# Patient Record
Sex: Female | Born: 1955
Health system: Southern US, Community
[De-identification: ages and names within clinical notes are randomized; demographics above are authoritative.]

## PROBLEM LIST (undated history)

## (undated) DIAGNOSIS — M1990 Unspecified osteoarthritis, unspecified site: Secondary | ICD-10-CM

## (undated) DIAGNOSIS — G4730 Sleep apnea, unspecified: Secondary | ICD-10-CM

## (undated) DIAGNOSIS — I10 Essential (primary) hypertension: Secondary | ICD-10-CM

## (undated) DIAGNOSIS — F32A Depression, unspecified: Secondary | ICD-10-CM

## (undated) DIAGNOSIS — N189 Chronic kidney disease, unspecified: Secondary | ICD-10-CM

## (undated) DIAGNOSIS — F329 Major depressive disorder, single episode, unspecified: Secondary | ICD-10-CM

## (undated) HISTORY — PX: LIPOMA EXCISION: SHX5283

## (undated) HISTORY — PX: DG THUMB LEFT HAND: HXRAD1658

---
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
## 1992-10-07 HISTORY — PX: KNEE ARTHROSCOPY: SUR90

## 1997-07-10 ENCOUNTER — Other Ambulatory Visit: Admission: RE | Admit: 1997-07-10 | Discharge: 1997-07-10 | Payer: Self-pay | Admitting: Obstetrics & Gynecology

## 1998-05-05 ENCOUNTER — Other Ambulatory Visit: Admission: RE | Admit: 1998-05-05 | Discharge: 1998-05-05 | Payer: Self-pay | Admitting: Obstetrics & Gynecology

## 1998-05-22 ENCOUNTER — Inpatient Hospital Stay (HOSPITAL_COMMUNITY): Admission: RE | Admit: 1998-05-22 | Discharge: 1998-05-26 | Payer: Self-pay | Admitting: Orthopedic Surgery

## 1998-05-24 HISTORY — PX: JOINT REPLACEMENT: SHX530

## 1999-05-21 ENCOUNTER — Other Ambulatory Visit: Admission: RE | Admit: 1999-05-21 | Discharge: 1999-05-21 | Payer: Self-pay | Admitting: Obstetrics & Gynecology

## 1999-09-08 ENCOUNTER — Encounter (INDEPENDENT_AMBULATORY_CARE_PROVIDER_SITE_OTHER): Payer: Self-pay | Admitting: *Deleted

## 1999-09-08 ENCOUNTER — Ambulatory Visit (HOSPITAL_BASED_OUTPATIENT_CLINIC_OR_DEPARTMENT_OTHER): Admission: RE | Admit: 1999-09-08 | Discharge: 1999-09-08 | Payer: Self-pay | Admitting: Surgery

## 2000-06-21 ENCOUNTER — Other Ambulatory Visit: Admission: RE | Admit: 2000-06-21 | Discharge: 2000-06-21 | Payer: Self-pay | Admitting: Obstetrics & Gynecology

## 2001-08-22 ENCOUNTER — Other Ambulatory Visit: Admission: RE | Admit: 2001-08-22 | Discharge: 2001-08-22 | Payer: Self-pay | Admitting: Obstetrics & Gynecology

## 2002-05-15 ENCOUNTER — Observation Stay (HOSPITAL_COMMUNITY): Admission: RE | Admit: 2002-05-15 | Discharge: 2002-05-16 | Payer: Self-pay | Admitting: Obstetrics & Gynecology

## 2002-05-15 ENCOUNTER — Encounter (INDEPENDENT_AMBULATORY_CARE_PROVIDER_SITE_OTHER): Payer: Self-pay | Admitting: Specialist

## 2002-06-25 HISTORY — PX: MOUTH SURGERY: SHX715

## 2002-08-28 ENCOUNTER — Other Ambulatory Visit: Admission: RE | Admit: 2002-08-28 | Discharge: 2002-08-28 | Payer: Self-pay | Admitting: Obstetrics & Gynecology

## 2002-12-18 HISTORY — PX: KNEE ARTHROSCOPY: SUR90

## 2003-09-24 ENCOUNTER — Other Ambulatory Visit: Admission: RE | Admit: 2003-09-24 | Discharge: 2003-09-24 | Payer: Self-pay | Admitting: Obstetrics & Gynecology

## 2004-10-26 ENCOUNTER — Other Ambulatory Visit: Admission: RE | Admit: 2004-10-26 | Discharge: 2004-10-26 | Payer: Self-pay | Admitting: Obstetrics & Gynecology

## 2004-12-21 ENCOUNTER — Encounter: Admission: RE | Admit: 2004-12-21 | Discharge: 2004-12-21 | Payer: Self-pay | Admitting: Obstetrics & Gynecology

## 2005-02-23 ENCOUNTER — Inpatient Hospital Stay (HOSPITAL_COMMUNITY): Admission: RE | Admit: 2005-02-23 | Discharge: 2005-02-26 | Payer: Self-pay | Admitting: Orthopedic Surgery

## 2005-02-23 HISTORY — PX: JOINT REPLACEMENT: SHX530

## 2005-09-22 ENCOUNTER — Ambulatory Visit (HOSPITAL_BASED_OUTPATIENT_CLINIC_OR_DEPARTMENT_OTHER): Admission: RE | Admit: 2005-09-22 | Discharge: 2005-09-22 | Payer: Self-pay | Admitting: Orthopedic Surgery

## 2005-09-22 HISTORY — PX: OTHER SURGICAL HISTORY: SHX169

## 2006-01-04 ENCOUNTER — Ambulatory Visit (HOSPITAL_BASED_OUTPATIENT_CLINIC_OR_DEPARTMENT_OTHER): Admission: RE | Admit: 2006-01-04 | Discharge: 2006-01-04 | Payer: Self-pay | Admitting: Orthopedic Surgery

## 2006-01-04 HISTORY — PX: TOTAL SHOULDER ARTHROPLASTY: SHX126

## 2007-10-12 ENCOUNTER — Encounter: Admission: RE | Admit: 2007-10-12 | Discharge: 2007-10-12 | Payer: Self-pay | Admitting: Orthopedic Surgery

## 2007-11-23 ENCOUNTER — Ambulatory Visit (HOSPITAL_BASED_OUTPATIENT_CLINIC_OR_DEPARTMENT_OTHER): Admission: RE | Admit: 2007-11-23 | Discharge: 2007-11-23 | Payer: Self-pay | Admitting: Orthopedic Surgery

## 2010-06-30 NOTE — Op Note (Signed)
NAME:  Jacqueline Riggs, Jacqueline Riggs                  ACCOUNT NO.:  [id number]   MEDICAL RECORD NO.:  [id number]          PATIENT TYPE:  AMB   LOCATION:  NESC                         FACILITY:  [facility]   PHYSICIAN:  Deidre Ala, M.D.    DATE OF BIRTH:  Oct 26, 1955   DATE OF PROCEDURE:  11/23/2007  DATE OF DISCHARGE:                               OPERATIVE REPORT   PREOPERATIVE DIAGNOSES:  1. Impingement tendinitis, supraspinatus left shoulder with type III      acromion.  2. AC (acromioclavicular) joint arthritis.  3. Partial thickness rotator cuff tear.   POSTOPERATIVE DIAGNOSES:  1. Left shoulder impingement with supraspinatus tendinosis and type      III acromion.  2. Partial SLAP (superior labrum anterior posterior) tear, stable.  3. Very arthritic distal clavicle.  4. Subdeltoid bursitis.   SURGEON:  1. Charlesetta Shanks, M.D.   PROCEDURES:  1. Left shoulder operative arthroscopy with subacromial arch      decompression acromioplasty.  2. Arthroscopic distal clavicle resection.  3. Debridement of subdeltoid bursectomy and debride SLAP lesion.   ASSISTANT:  Phineas Semen, P.A.   ANESTHESIA:  General with endotracheal and scalene block.   CULTURES:  None.   DRAINS:  None.   ESTIMATED BLOOD LOSS:  Minimal.   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  She has had right shoulder impingement, AC joint arthritis  and underwent successful SAD/DCR in the past.  The left shoulder has  been bothersome.  It has been unresponsive to conservative measures and  cortisone injections.  MRI was suggestive and showed moderate  supraspinatus tendinopathy and AC joint arthritis.  Due to persistence  of her discomfort it was elected to proceed with surgical intervention.  At surgery she had a type I SLAP fraying.  The biceps tendon was intact  and did not look pathologic.  She did not have detachment.  It was not  unstable but it was frayed.  This was debrided and  smoothed with the  ablator.  Undersurface of the rotator cuff looked good.  Glenohumeral  surface otherwise looked good.  There was some anterior glenoid fraying.  She had a sharp prominent anterior acromion with a prominent CA  ligament.  She was resected to [facility] margins including the CA  ligament.  She had an obviously arthritic distal clavicle.  She had an  intense subdeltoid bursitis that was debrided.  The rotator cuff was  intact to the tuberosity on all views.  She did have some partial  thickness fraying of the rotator cuff especially superiorly in the  critical zone.   PROCEDURE IN DETAIL:  With adequate anesthesia obtained using general  endotracheal and scalene block technique the patient was placed in the  supine beach chair position.  The left shoulder was prepped and draped  in the standard fashion.  After standard prepping and draping, skin  markings were made for anatomic positioning.  I then entered the  shoulder through a posterior portal.  Anterior portal was established  just lateral to the coracoid.  I then probed the superior labrum, shaved  it and smoothed it with the ablator on 1 and checked the biceps tendon.  Portals were reversed and similar shavings carried out.  Some light  shaving carried out on the anterior and posterior labrum.  I then  entered the subacromial space of the posterior portal.  Anterolateral  portal was established.  I then shaved the soft tissue from the anterior  undersurface of the acromion and ablated to cauterize.  The CA ligament  was released with a hook ArthroCare.  I then completed acromioplasty to  the roof of the subacromial space in the manner of Caspari.  The scope  was then turned medially sideways and through the anterior portal I  debrided the AC meniscus, brought in the shaver, further debrided and  cauterized.  I then completed distal clavicle resection two shaver  breadths in.  I then entered the shoulder from the  lateral portal and  with neutral internal and external rotation I debrided the subdeltoid  bursa and used the ablator to smooth.  I then lightly debrided/ablated  central zone/critical zone fraying of the rotator cuff.  The shoulder  was then irrigated through the scope.  Marcaine was not used since we  had a block.  The portals were closed with 4-0 nylon.  A bulky sterile  compressive dressing was applied with sling and the patient having  tolerated the procedure well was awakened and taken to the recovery room  in satisfactory condition to be discharged per outpatient routine and  given Percocet for pain and told to call the office for recheck  tomorrow.      Deidre Ala, M.D.  Electronically Signed     VEP/MEDQ  D:  11/23/2007  T:  11/23/2007  Job:  [id number]

## 2010-07-03 NOTE — Discharge Summary (Signed)
NAME:  Jacqueline Riggs, Jacqueline Riggs                  ACCOUNT NO.:  [id number]   MEDICAL RECORD NO.:  [id number]          PATIENT TYPE:  INP   LOCATION:  1516                         [facility]ACILITY:  [facility]   PHYSICIAN:  Deidre Ala, M.D.    DATE OF BIRTH:  01-13-1956   DATE OF ADMISSION:  02/23/2005  DATE OF DISCHARGE:  02/26/2005                                 DISCHARGE SUMMARY   ADMISSION DIAGNOSES:  1.  End-stage osteoarthritis of the right knee.  2.  Hypertension.  3.  Hormone replacement therapy.  4.  Gastroesophageal reflux disease.  5.  Allergies.  6.  Depression.   DISCHARGE DIAGNOSES:  1.  End-stage osteoarthritis of the right knee.  2.  Status post right toe and knee arthroplasty.  3.  Hypertension.  4.  Hormone replacement therapy.  5.  Gastroesophageal reflux disease.  6.  Allergies.  7.  Depression.  8.  Postoperative hemorrhagic anemia, stable at the time of discharge.   PROCEDURES:  The patient was taken to the operating room on February 23, 2005  and underwent a right total knee arthroplasty.  The surgeon was Dr. Deidre Ala.  Assistant is Clarene Reamer, P.A.-C.  The surgery was done under  spinal anesthesia.  Hemovac drain x1 was placed at the time of surgery.   CONSULTATIONS:  1.  Physical therapy.  2.  Occupational therapy.  3.  Social work.  4.  Case management.   BRIEF HISTORY:  The patient is a 55 year old female with a history of right  knee pain.  She has previously undergone a left total knee arthroplasty.  She has also previously undergone a right knee arthroscopy followed by a  series of __________ arch injections which have failed her to this point.  Upon failure of conservative treatment, Dr. Renae Fickle felt that it was best to  proceed with the right total knee arthroplasty.  The patient agreed.  The  risks and benefits of the surgery were discussed with the patient and the  patient wished to proceed.   LABORATORY DATA:  CBC, on admission, showed a hemoglobin of  13.4, hematocrit  40.0, white blood cell count 5.1, red blood cell count 4.41.  Serial H&H  were followed throughout the hospital stay.  Hemoglobin and hematocrit did  decline to 10.5 and 31.0, respectively but was stable by the time of  discharge.  Differential on admission was all within normal limits.  Coagulation studies on admission were all within normal limits.  PT and INR  at the time of discharge were 28.4 and 2.7, respectively on Coumadin  therapy.  Routine chemistry on admission was all within normal limits.  Serum chemistries were also taken throughout hospital stay.  Glucose ranged  from low of 95 on day of discharge to a high 127 on February 24, 2005.  BUN  did fall to 3 on February 25, 2005 but was back in the normal range the  following day.  Urinalysis showed color amber, appearance hazy, bilirubin  small amount, and ketones trace amount.  The patient's blood type is O-  positive with antibody screen negative.  Preop chest x-ray showed no active  cardiopulmonary disease.  Preop EKG showed marked sinus tachycardia with  nonspecific ST abnormality.   HOSPITAL COURSE:  The patient was admitted to [facility] and taken  to the operating room.  She underwent the above stated procedure without  complications.  The patient tolerated the procedure well, allowed to return  to recovery room on orthopedic floor and obtained postoperative care.  Postoperative day #1, the patient was resting comfortably.  Hemoglobin and  hematocrit 11.2 and 33.3, T-max was 99.4.  She was neurovascular intact to  the right lower extremity.  Dressing was clean, dry and intact.  The patient  was to work with physical therapy and occupational therapy.  On  postoperative day #2, the patient was resting comfortably.  T-max was 100.9.  Hemoglobin and hematocrit 11.1 and 32.7.  Incision was clean, dry and  intact.  She remained neurovascular intact to the right lower extremity.  The patient is to work  with physical therapy and occupational therapy.  PCA  was discontinued on this day.  IV was heplocked, dressing was changed with  plan of being discharged home the following day.  On February 26, 2005,  postoperative day #3, the patient was doing well.  T-max 99.  She was ready  for discharge.  Hemoglobin and hematocrit 10.5 and 31.0.  Incision was  clean, dry and intact and patient was to be discharged home on this date.   DISPOSITION:  The patient was discharged home on February 26, 2005.   DISCHARGE MEDICATIONS:  1.  Duragesic patch 25 mcg, 1 patch q.72h.  2.  Percocet 5/325 mg, 1-2 p.o. q.4-6h. p.r.n. pain.  3.  Robaxin 500 mg, 1 p.o. q.6-8h. p.r.n. spasm.  4.  Coumadin per pharmacy protocol.  5.  Trinsicon caplets #90, 1 p.o. t.i.d.   DIET:  As tolerated.   ACTIVITY:  The patient is weight-bearing as tolerated to the right lower  extremity.   WOUND CARE:  The patient is to have daily dressing changes performed until  no drainage.  She may shower when no drainage.   FOLLOW UP:  The patient will follow up with Dr. Renae Fickle in two weeks from the  date of surgery.  She is to call the office for an appointment at [phone].   CONDITION ON DISCHARGE:  Stable and improved.     ______________________________  Clarene Reamer, P.A.-C.    ______________________________  V. Charlesetta Shanks, M.D.   SW/MEDQ  D:  02/26/2005  T:  02/26/2005  Job:  [id number]

## 2010-07-03 NOTE — Op Note (Signed)
NAME:  Jacqueline Riggs, Jacqueline Riggs                            ACCOUNT NO.:  [id number]   MEDICAL RECORD NO.:  [id number]                   PATIENT TYPE:  OBV   LOCATION:  9399                                 FACILITY:  [facility]   PHYSICIAN:  Freddy Finner, M.D.                DATE OF BIRTH:  [birth year]   DATE OF PROCEDURE:  05/15/2002  DATE OF DISCHARGE:                                 OPERATIVE REPORT   PREOPERATIVE DIAGNOSIS:  Uterine fibroids, persistent cystic left ovarian  mass.   POSTOPERATIVE DIAGNOSIS:  Uterine fibroids, persistent cystic left ovarian  mass.   PROCEDURE:  Laparoscopically-assisted vaginal hysterectomy, bilateral  salpingo-oophorectomy.   SURGEON:  Freddy Finner, M.D.   ASSISTANT:  Tracie Harrier, M.D.   ESTIMATED BLOOD LOSS:  100 mL.   COMPLICATIONS:  None.   ANESTHESIA:  General endotracheal.   INDICATIONS FOR PROCEDURE:  Details of the present illness are recorded in  the admission note. The patient was admitted on the morning of surgery. She  has numerous allergies to medications. She was given Clindamycin 600 mg IV  preoperatively for prophylaxis. She was placed in PAS hose.   DESCRIPTION OF PROCEDURE:  She was brought to the operating room and placed  under adequate general endotracheal anesthesia and placed in the dorsal  lithotomy using the [facility] stirrup system. Betadine prep with scrub followed  by solution was carried out of the abdomen, perineum, and vagina. The  bladder was evacuated with non-latex sterile catheter. Hulka tenaculum was  attached to the cervix without difficulty under direct visualization.  Sterile drapes were applied.  Two small incisions were made, one at the  umbilicus and one just above the symphysis.  An 11 mm non-bladed disposable  trocar was placed at the umbilicus. Inspection eventually confirmed entry  into the peritoneum with no evidence of injury.  The pneumoperitoneum was  allowed to accumulate.  5 mm trocar was placed  through the lower incision  under direct visualization. Scanning inspection of the abdomen and pelvic  structures was carried out. Photographs were made and retained in the office  record. The appendix was visualized and it was normal. The right tube and  ovary were normal. The left ovary was cystically enlarged, but no external  excrescences and no evidence of any intraperitoneal disease. The uterus was  irregularly nodular and approximately 9 weeks size.  Using the spring-loaded  grasping forceps through the lower trocar, the adnexa were elevated and then  with progressive pedicles, the infundibulopelvic and upper broad ligaments  were sealed and divided using the Gyrus bipolar device. This was carried  down to a level just above the uterine arteries. Hemostasis at this point  was adequate. Attention was turned vaginally.  Posterior weighted retractor  was placed. The cervix was grasped with a Jacobs tenaculum and the Hulka  tenaculum removed. Deavers were used to retract the lateral  and anterior  vaginal walls.  The mucosa posterior to the cervix was grasped with an Allis  and entered sharply with Mayo scissors. The cervix was circumscribed with  the scalpel to release the mucosa. Using the LigaSure system, the  uterosacral pedicles were sealed and divided.  Bladder pillars were taken  separately, sealed, and divided.  The bladder was carefully advanced off the  cervix. Cardinal ligament pedicles were sealed and divided using LigaSure.  Anterior peritoneum was entered.  Vessel pedicles were taken on each side,  sealed, and divided.  An additional pedicle was taken on either side above  the vessels, sealed, and divided. The uterus was then delivered through the  vaginal introitus. One remaining small pedicle on the right was sealed with  LigaSure. Angles of the vagina were then anchored to the uterosacrals with  mattress suture of 0 Monocryl. The uterosacrals were plicated. The posterior   peritoneum was closed with a 0 Monocryl single suture. Cuff was closed  vertically with figure-of-eights of 0 Monocryl. Attention was redirected  abdominally after placement of the Silastic Foley. The Nezhat irrigating  system was used. Careful examination of all pedicles was carried out. Scant  amount of oozing was noted near the uterosacrals off the vaginal apex and  this was controlled with the Gyrus system. With complete hemostasis, all of  the irrigating solution was aspirated from the abdomen.  Gas was allowed to  escape from the abdomen. Skin incisions were closed with interrupted  subcuticular sutures of 3-0 Dexon.  0.5% plain Marcaine was injected through  the incision sites for postoperative analgesia. Steri-Strips were applied to  the lower incisions. The patient tolerated the procedure well. She was  awakened and taken to the recovery room in good condition.                                               Freddy Finner, M.D.    WRN/MEDQ  D:  05/15/2002  T:  05/15/2002  Job:  [id number]

## 2010-07-03 NOTE — H&P (Signed)
NAME:  Jacqueline Riggs, Jacqueline Riggs                            ACCOUNT NO.:  [id number]   MEDICAL RECORD NO.:  [id number]                   PATIENT TYPE:  AMB   LOCATION:  SDC                                  FACILITY:  [facility]   PHYSICIAN:  Freddy Finner, M.D.                DATE OF BIRTH:  1955/12/30   DATE OF ADMISSION:  05/15/2002  DATE OF DISCHARGE:                                HISTORY & PHYSICAL   ADMISSION DIAGNOSES:  1. Persistent left adnexal cystic mass.  2. Uterine leiomyomata.  3. Chronic pelvic pain.   HISTORY OF PRESENT ILLNESS:  The patient is a 55 year old white married  female, nulligravida, who has a long history of pelvic pain.  She has  uterine leiomyomata which had progressively increased in size.  She has a  simple cystic left adnexal mass which has persisted now for three months.  Due to the constellation of findings, the patient has requested definitive  surgery.  She is now admitted for a laparoscopically-assisted vaginal  hysterectomy and bilateral salpingo-oophorectomy.  She has reviewed a video  in the office describing the operative procedure, including the potential  risks of the procedure.   REVIEW OF SYSTEMS:  Her current review of systems is otherwise negative  including cardiac, pulmonary, GI, or GENITOURINARY complaints.   PAST MEDICAL HISTORY:  The patient is known to have hypertension.  She has  no other known significant medical problems.   MEDICATIONS:  1. For hypertension Lotrel 5/20, one daily.  2. Labetalol 200 mg, two daily.  3. Other medications on a chronic basis include Estratest 1.25 mg daily and     2.5 mg daily, a total of 3.75 mg daily.  4. Prometrium 100 mg daily.  5. Serzone 200 mg, three daily.  6. Vioxx 25 mg, one daily.  7. Lipitor 5 mg, one daily.   ALLERGIES:  Numerous, including PENICILLIN, SULFA, CECLOR, RELAFEN AND  HYDROCHLOROTHIAZIDE.   PAST SURGICAL HISTORY:  Knee surgeries on three different occasions in 1994,  1998, and  1999.  She has never had a blood transfusion.   SOCIAL HISTORY:  She does not use cigarettes.  She uses alcohol  occasionally.   FAMILY HISTORY:  Noncontributory.   PHYSICAL EXAMINATION:  HEENT:  Grossly within normal limits.  VITAL SIGNS:  Blood pressure in the office 136/78.  NECK:  The thyroid gland is not palpably enlarged.  CHEST:  Clear to auscultation throughout.  HEART:  Normal sinus rhythm without murmurs, rubs, or gallops.  BREASTS:  Examination is normal.  No palpable masses or skin changes, no  nipple discharge.  The most recent mammogram in July 2003, was normal.  ABDOMEN:  Soft, nontender, without appreciable organomegaly or palpable  masses.  PELVIC:  External genitalia, vagina and cervix were normal.  Uterus is  palpably enlarged on bimanual examination to about 8-10 weeks size,  compromised somewhat by body  habitus.  There is some tenderness to deep  palpation in the right adnexa, but no palpable mass in this location.  The  left adnexa is not palpably enlarged.  RECTAL:  The rectum is normal.  RECTOVAGINAL:  The examination confirms the above findings.  EXTREMITIES:  Without clubbing, cyanosis, or edema.  Ultrasounds in the office serially have shown persistence of a simple left  ovarian mass measuring 3.1 cm x 3.3 cm x 2.7 cm.  At least five leiomyomata  are also identified, the largest being approximately 3.5 cm.   ASSESSMENT:  1. Uterine leiomyomata.  2. Persistent cystic left adnexal mass.  3. Chronic pelvic pain.   PLAN:  Laparoscopically-assisted vaginal hysterectomy with bilateral  salpingo-oophorectomy, antibiotic prophylaxis along with compression hose  for deep vein thrombosis prophylaxis.                                                 Freddy Finner, M.D.    WRN/MEDQ  D:  05/14/2002  T:  05/14/2002  Job:  [id number]

## 2010-07-03 NOTE — Op Note (Signed)
NAME:  BRUNETTA, NEWINGHAM                  ACCOUNT NO.:  [id number]   MEDICAL RECORD NO.:  [id number]          PATIENT TYPE:  AMB   LOCATION:  DSC                          FACILITY:  [facility]   PHYSICIAN:  Deidre Ala, M.D.    DATE OF BIRTH:  11/11/1955   DATE OF PROCEDURE:  09/22/2005  DATE OF DISCHARGE:                                 OPERATIVE REPORT   SURGEON:  1.  Charlesetta Shanks, M.D.   ASSISTANT:  Thereasa Distance, P.A.-C.   PREOPERATIVE DIAGNOSIS:  Left third finger trigger finger, stenosing  tenosynovitis, A1 pulley.   POSTOPERATIVE DIAGNOSIS:  Left third finger trigger finger, stenosing  tenosynovitis, A1 pulley.   PROCEDURE:  Left third finger, middle finger release of A1 pulley, trigger  finger stenosing tenosynovitis.   ANESTHESIA:  IV regional.   CULTURES:  None.   DRAINS:  None.   ESTIMATED BLOOD LOSS:  Minimal.   TOURNIQUET TIME:  30 minutes.   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  She has had difficulties with triggering of the left third finger.  She came on 08/09/05.  Two cortisone shots have ensued, but the finger is  still catching.  She desired release.  At surgery, no untoward features were  noted.  The A1 pulley was released well with good gliding of the tendon.   DESCRIPTION OF PROCEDURE:  With adequate anesthesia obtained using IV  regional technique, 300 mg Cleocin given IV prophylaxis, the patient was  placed in the supine position and the left hand was prepped from the  fingertips to the upper forearm in the standard fashion.  After standard  prepping and draping, an incision was made obliquely in Langer skin lines at  the distal palmar flexion crease transversely and obliquely to the A1  pulley.  Incision was deepened sharply to allow for hemostasis to be  obtained using the Bovie electrocoagulator.  Under loupe magnification,  careful dissection was carried down to the A1 pulley and retractors were  placed.  I  then released it with scissors and removed some of the A1 pulley  on either side.  The tendon was then seen to glide freely.  Irrigation was  carried out and the wound was closed with a running 4-0 nylon.  A bulky  sterile compressive  dressing was applied to allow finger motion. The patient, having tolerated  the procedure well, was awakened and taken to the recovery room in  satisfactory condition, to be discharged per outpatient routine, given  Vicodin for pain and told to call the office for an appointment for recheck  on Friday.           ______________________________  V. Charlesetta Shanks, M.D.     VEP/MEDQ  D:  09/22/2005  T:  09/22/2005  Job:  [id number]

## 2010-07-03 NOTE — Op Note (Signed)
NAME:  Jacqueline Riggs, Jacqueline Riggs                  ACCOUNT NO.:  [id number]   MEDICAL RECORD NO.:  [id number]          PATIENT TYPE:  AMB   LOCATION:  [facility]                         FACILITY:  [facility]   PHYSICIAN:  Deidre Ala, M.D.    DATE OF BIRTH:  [birth year]   DATE OF PROCEDURE:  01/04/2006  DATE OF DISCHARGE:                                 OPERATIVE REPORT   PREOPERATIVE DIAGNOSES:  1. Right shoulder impingement syndrome with type 2 to 3 acromion.  2. Rule out right shoulder rotator cuff tear.  3. Acromioclavicular joint arthritis. right shoulder.  4. Right shoulder biceps long head tendinitis with high-grade tear.   POSTOPERATIVE DIAGNOSES:  1. Right shoulder nonrepairable rotator cuff tear, extensive.  2. Right shoulder impingement with type 3 acromion.  3. High-grade tear, biceps tendon, intracapsular.  4. Labral tearing.   OPERATION:  1. Right shoulder operative arthroscopy with subacromial arch      decompression acromioplasty.  2. Extensive debridement of right shoulder nonrepairable rotator cuff      tear.  3. Debridement of glenoid labrum and also biceps tenotomy - intracapsular.  4. Arthroscopic distal clavicle resection.  5. Tuberosity plasty, right shoulder.   SURGEON:  1. Charlesetta Shanks, M.D.   ASSISTANT:  Clarene Reamer, P.A.-C.   ANESTHESIA:  General endotracheal.   CULTURES:  None.   DRAINS:  None.   BLOOD LOSS:  Minimal.   PATHOLOGIC FINDINGS AND HISTORY:  Avis is a 55 year old female with high  body mass index with multiple histories of orthopedics problems in the past  including trigger fingers, painful thumb, bilateral total knees.  She is a  career Engineer, manufacturing and has had greater than six-month history of  right shoulder pain without known injury.  Initial x-rays revealed a type 2  to 3 acromion.  She had evidence of a biceps tendinitis and received several  cortisone injections.  Ultimately, an MRI scan showed poor delineation of  the biceps tendon in  the region of the bicipital groove suggestive of a high-  grade tear, partial versus complete.  There was tendinosis of the rotator  cuff suggestive of a partial-thickness tear.  There was AC joint  degenerative change with slight irregularity of the labrum.  After failure  of conservative management, she elected to proceed with arthroscopic  intervention.  At surgery, she had no slap detachment, fraying of the  superior labrum and anterior posterior labrum.  The glenohumeral surface had  only minor changes.  However, there was marked intrasubstance tear of the  biceps tendon from its origin all the way out into the groove and when  pulled into the joint in the groove.  This was tenotomized.  She had a  sharp, craggy anterior acromion with a hook, obviously arthritic distal  clavicle.  The rotator cuff was torn in multiple ways with a center bridge  and was torn at the level and retracted up to the glenohumeral joint.  This  was fully debrided with a leading edge remaining of the subscapularis and  infraspinatus with a tuberosity plasty carried out over  the tuberosity to  prevent further impingement.  She had SAD DCR Caspari margins along with the  tuberosity plasty and the debridement as above.   PROCEDURE:  With adequate anesthesia obtained using endotracheal technique,  500 mg vancomycin given IV prophylaxis, the patient was placed in the supine  beach-chair position.  The right shoulder was prepped and draped in the  standard fashion.  After standard prepping and draping, skin markings were  made for anatomic positioning.  Twenty mL of 0.5% Marcaine with epinephrine  was injected in the subacromial space to open it up.  I then entered the  shoulder through a posterior portal.  Anterior portal was established just  lateral to the coracoid.  I probed the labrum and the biceps tendon.  I then  did some shaving on the undersurface of the rotator cuff, the labrum  anterior and along the  biceps and analyzed its poor state by pulling it also  into the joint and then tenotomized it at the level of its attachment on the  superior labrum and allowed it to retract into the groove.  I then debrided  the superior labrum and smoothed with the ablator.  Portals reversed and  similar shavings carried out.  I then entered the subacromial space through  the posterior portal.  Anterolateral portal was established.  I then removed  bursa and soft tissue from the anterior undersurface of the acromion, used  the shaver to smooth and the ablator to cauterize.  I then brought in a 6.0  bur, completed acromioplasty to the roof of the subacromial space in the  manner of Caspari.  I then turned the scope medially sideways and through an  anterior portal completed distal clavicle resection, having first resected  the AC meniscus.  I resected 2 shaver breadths in.  I then entered the  shoulder through the lateral portal and completed acromioplasty back to the  bicortical bone in the manner of Caspari and smoothed it with the ablator.  I then turned the scope downward and assessed the nonrepairable rotator cuff  tear which I used basket shaver and meniscus scissors to debride, leaving  the anterior posterior leaf, debrided it back to the tuberosity and then  completed tuberosity plasty, having used for these maneuvers an additional  anterolateral portal and a bur down the tuberosity.  Bleeding points were  cauterized.  The shoulder was irrigated through the scope.  Marcaine 0.5%  injected about the portals in addition to a scalene block.  The portals were  closed.  The bulky sterile compressive dressing was applied with a sling.  The patient having tolerated procedure well was awakened and taken to  recovery room in satisfactory condition to be discharged per outpatient  routine, given Percocet for pain and told call the office for recheck  tomorrow.           ______________________________   V. Charlesetta Shanks, M.D.     VEP/MEDQ  D:  01/04/2006  T:  01/04/2006  Job:  [id number]

## 2010-07-03 NOTE — Op Note (Signed)
[place]. [facility]  Patient:    Jacqueline Riggs, Jacqueline Riggs                           MRN: [id number] Proc. Date: 09/08/99 Attending:  Abigail Miyamoto, M.D.                           Operative Report  PREOPERATIVE DIAGNOSIS:  Right flank lipoma.  POSTOPERATIVE DIAGNOSIS:  Right flank lipoma.  PROCEDURE:  Excision of large right flank lipoma.  SURGEON:  Abigail Miyamoto, M.D.  ANESTHESIA:  General endotracheal anesthesia and 0.25% Marcaine plain.  ESTIMATED BLOOD LOSS:  Minimal.  PROCEDURE IN DETAIL:  The patient was brought to the operating room and identified as Jacqueline Riggs.  She was placed supine on the operating room table and general anesthesia was induced.  Next, the patient was placed in the left lateral decubitus position.  Next, a transverse incision was made across the palpable mass on the patients right flank.  The incision was carried down through the fascia with the electrocautery.  The flank muscle was then identified and separated bluntly.  The large lipoma was then controlled circumferentially with blunt dissection and elevated up out of the wound.  The very large lipoma was then completely transected with the electrocautery. Several other small pieces of lipomatous fat was excised with the cautery.  At this point, the wound was thoroughly irrigated with normal saline.  The fascia was then reapproximated with interrupted 2-0 Vicryl sutures.  The subcutaneous layer was then closed with interrupted 2-0 Vicryl sutures and skin was closed with a running 4-0 Monocryl.  The skin was then anesthetized with 0.25% Marcaine plain.  Half-inch Steri-Strips, gauze and Tegaderm were then applied.  The patient tolerated the procedure well.  All sponge, needle and instrument counts were correct at the end of our procedure.  The patient was then extubated in the operating room and taken in a stable condition to the recovery room. DD:  09/08/99 TD:  09/09/99 Job:  [id number]

## 2010-07-03 NOTE — Op Note (Signed)
NAME:  Jacqueline, Riggs                  ACCOUNT NO.:  [id number]   MEDICAL RECORD NO.:  [id number]          PATIENT TYPE:  INP   LOCATION:  0003                         FACILITY:  [facility]   PHYSICIAN:  Deidre Ala, M.D.    DATE OF BIRTH:  Sep 10, 1955   DATE OF PROCEDURE:  02/23/2005  DATE OF DISCHARGE:                                 OPERATIVE REPORT   PREOPERATIVE DIAGNOSIS:  1.  End-stage degenerative joint disease, right knee.  2.  High body mass index.   POSTOPERATIVE DIAGNOSES:  1.  End-stage degenerative joint disease, right knee.  2.  High body mass index.   PROCEDURE:  Right total knee arthroplasty using cemented DePuy components,  LCS type, with rotating platform with MBT revision type stem.   SURGEON:  1.  Charlesetta Shanks, M.D.   ASSISTANT:  Clarene Reamer, P.A.-C.   ANESTHESIA:  Spinal with sedation.   CULTURES:  None.   DRAINS:  Two medium Hemovacs and Autovac.   ESTIMATED BLOOD LOSS:  Less than 100 cc replacement.   TOURNIQUET TIME:  One hour, 26 minutes.   PATHOLOGIC FINDINGS/HISTORY:  Jacqueline Riggs is a patient who has been long term.  She has had left total knee arthroplasty in the past that has done well with  a standard non-keel tibial stem with rotating platform.  She did have some  stem pain and was on Fosamax.  That ultimately cleared.  On the right side,  she had significant patellofemoral disease than she had on the left.  We did  a scope debridement with lateral release.  She has also had some Hyalgan,  and we have nursed her along for over a year or two.  The right knee finally  got to the point where she was having with every step, night pain, was on  chronic pain management with a Duragesic patch by Jacqueline Riggs, M.D., at our  office.  In any case, at this point, because she has gained some weight, I  felt with her history of stem pain, it would be better to use the new  technology, primarily putting a line-to-line MBT stem in place.  We reamed  to a 12 but  ultimately with the stem length, we could only fit down a 10,  but it was line-to-line reamed with a very tight distal tibial canal.  We  had good three point fixation using the 75 x 10 mm tibial stem with cement  to the flutes.  The cement was from the component tray to the flutes but not  past.  We ended up using standard plus right femur, a 15 mm rotating  platform, a #3 revision cemented tray, a 10 x 75 mm stem, and an Overdome  patellar 32 mm.  We had full extension, good ligamentous stability with  flexion to 105 degrees.  The patella tracked well.   PROCEDURE:  Anesthesia obtained using spinal technique.  The patient is  placed in a supine position.  The right lower extremity is prepped from the  toes to the tourniquet in a standard fashion.  After standard  prepping and  draping, esmarch exsanguination was used.  The tourniquet was inflated up to  375 mmHg.  Vancomycin 1 gm was given IV prophylaxis due to a PENICILLIN  allergy.  The patient then, after standard prepping and draping, had a  medium parapatellar skin incision followed by a medium parapatellar  retinacular incision.  The incision was deep and sharp with the knife, and  hemostasis obtained using Bovie electrocoagulator.  The flap was developed  laterally over the patella with the patella everted and the knee flexed.  I  then excised the fat pad, the menisci, and the cruciates.  Pitting arthritis  was noted in the medial femoral condyle over the entire posterior patella.  At this point, I amputated the tibial spines, removed the cruciates.  The  central drill was then placed down the tibial canal, and we reamed up to a  12.  We then placed the cutting jig in place, and we made our first cut at  the 12 mark, then cut later more, as I will mention, to follow.  We then  trialed for the standard plus anterior posterior cutting jig, put it in  place, made the cut.  We were tied at a 10, so  I cut 5 mm more on the  tibia, then  we fit the 15 mm block in flexion.  I then placed the 4 degree  valgus distal cutting jig in place.  I made that cut, which was very  conservative, but it did fit the 15 in extension.  I then placed the  anterior posterior chamfer cutting jig in place and made those cuts as well  as a notch cut.  There was no need to make the far posterior cuts.  Then  exposed the proximal tibia size to a 3.  Brought the smoke stack in and  reamed down the proximal portion of the stem, but it still would not go, so  I reamed up again distally with the reamers but felt I should stick with a  10, getting cortical bone further distal, where I needed to be to sink the  prosthesis trial down.  I ultimately clipped the prosthesis trial down with  a 10 mm stem and impacted it down with excellent three point fixation.  The  keel component was also broached.  At this point, we trialed the 15 with the  standard plus femur, and the knee articulated to a full range of motion, as  above, with full extension and good stability.  The patella was then  measured, caliper measurement to a 21.  We cut it down actually to a 14 but  replaced 8, and this was an appropriate patellar thickness for stability.  We placed the three peg template and made those holes.  Placed the patellar  button in place and articulated the knee through good range of motion.  All  trial components were then removed while the knee was thoroughly jet-  lavaged.  We then checked components coming on the field for sizing.  We  placed the MBT stem on the component, articulated midway down, and tightened  it with the DePuy device for this.  We then mixed cement with vancomycin in  the cement gun.  Two batches of DePuy cement was used.  We then cemented on  the tibial component, impacted it, removed this excess cement.  We then put  the rotating platform in 15 mm, and then we put cement on the distal femur, cemented onto  the distal femur component, impacted  it, and removed excess  cement, held in full extension, removed excess cement, and then 35 degrees  until the cement had cured.  We then cemented on the patellar component,  impacted it.  I removed excess cement and held it with a clamp until the  cement had hardened.  When the cement had hardened, the tourniquet was let  down, and bleeding points cauterized.  Additional jet lavage was carried  out.  The knee then had hemostasis obtained.  Hemovac drains were then  placed in the medial lateral joint line and brought through the  superolateral portals.  The knee was then closed with an interrupted #1  Vicryl oversewn with #1 PDS running locking.  The subcu was then closed with  1-0, 2-0, and 3-0 Vicryl and skin staples.  Hemovac was hooked up to  Autovac, and bulky  sterile compressive dressing  was applied with knee immobilizer.  The  patient having tolerated the procedure well was awakened and taken to the  recovery room in satisfactory condition for routine postoperative care, CPM,  and analgesia.           ______________________________  V. Charlesetta Shanks, M.D.     VEP/MEDQ  D:  02/23/2005  T:  02/23/2005  Job:  [id number]   cc:   Jethro Bastos, M.D.  Fax: [phone]   W. Varney Baas, M.D.  Fax: [phone]

## 2010-11-16 LAB — POCT I-STAT 4, (NA,K, GLUC, HGB,HCT)
Glucose, Bld: 85
HCT: 36
Hemoglobin: 12.2
Potassium: 3.5
Sodium: 140

## 2013-10-24 ENCOUNTER — Other Ambulatory Visit: Payer: Self-pay | Admitting: Obstetrics & Gynecology

## 2013-10-25 LAB — CYTOLOGY - PAP

## 2014-04-27 ENCOUNTER — Emergency Department (HOSPITAL_COMMUNITY)
Admission: EM | Admit: 2014-04-27 | Discharge: 2014-04-27 | Disposition: A | Discharge status: Home / Self Care | Payer: Federal, State, Local not specified - PPO | Attending: Emergency Medicine | Admitting: Emergency Medicine

## 2014-04-27 ENCOUNTER — Emergency Department (HOSPITAL_COMMUNITY): Payer: Federal, State, Local not specified - PPO

## 2014-04-27 DIAGNOSIS — Y9389 Activity, other specified: Secondary | ICD-10-CM | POA: Insufficient documentation

## 2014-04-27 DIAGNOSIS — W540XXA Bitten by dog, initial encounter: Secondary | ICD-10-CM | POA: Diagnosis not present

## 2014-04-27 DIAGNOSIS — S61011A Laceration without foreign body of right thumb without damage to nail, initial encounter: Secondary | ICD-10-CM | POA: Insufficient documentation

## 2014-04-27 DIAGNOSIS — Y998 Other external cause status: Secondary | ICD-10-CM | POA: Diagnosis not present

## 2014-04-27 DIAGNOSIS — S61051A Open bite of right thumb without damage to nail, initial encounter: Secondary | ICD-10-CM

## 2014-04-27 DIAGNOSIS — Y9289 Other specified places as the place of occurrence of the external cause: Secondary | ICD-10-CM | POA: Insufficient documentation

## 2014-04-27 DIAGNOSIS — T148XXA Other injury of unspecified body region, initial encounter: Secondary | ICD-10-CM

## 2014-04-27 MED ORDER — OXYCODONE-ACETAMINOPHEN 5-325 MG PO TABS
1.0000 | ORAL_TABLET | Freq: Once | ORAL | Status: AC
  Administered 2014-04-27: 1 via ORAL
  Filled 2014-04-27: qty 1

## 2014-04-27 MED ORDER — DOXYCYCLINE HYCLATE 100 MG PO CAPS: 100.0000 mg | ORAL_CAPSULE | Freq: Two times a day (BID) | ORAL | Status: DC

## 2014-04-27 MED ORDER — CIPROFLOXACIN HCL 500 MG PO TABS: 500.0000 mg | ORAL_TABLET | Freq: Two times a day (BID) | ORAL | Status: DC

## 2014-04-27 MED ORDER — LIDOCAINE HCL (PF) 1 % IJ SOLN
5.0000 mL | Freq: Once | INTRAMUSCULAR | Status: AC
  Administered 2014-04-27: 5 mL via INTRADERMAL
  Filled 2014-04-27: qty 5

## 2014-04-27 MED ORDER — OXYCODONE-ACETAMINOPHEN 5-325 MG PO TABS: 2.0000 | ORAL_TABLET | ORAL | Status: DC | PRN

## 2014-04-27 NOTE — Discharge Instructions (Signed)
Animal Bite °An animal bite can result in a scratch on the skin, deep open cut, puncture of the skin, crush injury, or tearing away of the skin or a body part. Dogs are responsible for most animal bites. Children are bitten more often than adults. An animal bite can range from very mild to more serious. A small bite from your house pet is no cause for alarm. However, some animal bites can become infected or injure a bone or other tissue. You must seek medical care if: °· The skin is broken and bleeding does not slow down or stop after 15 minutes. °· The puncture is deep and difficult to clean (such as a cat bite). °· Pain, warmth, redness, or pus develops around the wound. °· The bite is from a stray animal or rodent. There may be a risk of rabies infection. °· The bite is from a snake, raccoon, skunk, fox, coyote, or bat. There may be a risk of rabies infection. °· The person bitten has a chronic illness such as diabetes, liver disease, or cancer, or the person takes medicine that lowers the immune system. °· There is concern about the location and severity of the bite. °It is important to clean and protect an animal bite wound right away to prevent infection. Follow these steps: °· Clean the wound with plenty of water and soap. °· Apply an antibiotic cream. °· Apply gentle pressure over the wound with a clean towel or gauze to slow or stop bleeding. °· Elevate the affected area above the heart to help stop any bleeding. °· Seek medical care. Getting medical care within 8 hours of the animal bite leads to the best possible outcome. °DIAGNOSIS  °Your caregiver will most likely: °· Take a detailed history of the animal and the bite injury. °· Perform a wound exam. °· Take your medical history. °Blood tests or X-rays may be performed. Sometimes, infected bite wounds are cultured and sent to a lab to identify the infectious bacteria.  °TREATMENT  °Medical treatment will depend on the location and type of animal bite as  well as the patient's medical history. Treatment may include: °· Wound care, such as cleaning and flushing the wound with saline solution, bandaging, and elevating the affected area. °· Antibiotics. °· Tetanus immunization. °· Rabies immunization. °· Leaving the wound open to heal. This is often done with animal bites, due to the high risk of infection. However, in certain cases, wound closure with stitches, wound adhesive, skin adhesive strips, or staples may be used. ° Infected bites that are left untreated may require intravenous (IV) antibiotics and surgical treatment in the hospital. °HOME CARE INSTRUCTIONS °· Follow your caregiver's instructions for wound care. °· Take all medicines as directed. °· If your caregiver prescribes antibiotics, take them as directed. Finish them even if you start to feel better. °· Follow up with your caregiver for further exams or immunizations as directed. °You may need a tetanus shot if: °· You cannot remember when you had your last tetanus shot. °· You have never had a tetanus shot. °· The injury broke your skin. °If you get a tetanus shot, your arm may swell, get red, and feel warm to the touch. This is common and not a problem. If you need a tetanus shot and you choose not to have one, there is a rare chance of getting tetanus. Sickness from tetanus can be serious. °SEEK MEDICAL CARE IF: °· You notice warmth, redness, soreness, swelling, pus discharge, or a bad   smell coming from the wound.  You have a red line on the skin coming from the wound.  You have a fever, chills, or a general ill feeling.  You have nausea or vomiting.  You have continued or worsening pain.  You have trouble moving the injured part.  You have other questions or concerns. MAKE SURE YOU:  Understand these instructions.  Will watch your condition.  Will get help right away if you are not doing well or get worse. Document Released: 10/20/2010 Document Revised: 04/26/2011 Document  Reviewed: 10/20/2010 [facility] Patient Information 2015 [place], [place]. This information is not intended to replace advice given to you by your health care provider. Make sure you discuss any questions you have with your health care provider.  Return for worsening symptoms such as edema, redness, or streaking.

## 2014-04-27 NOTE — ED Notes (Signed)
PA at bedside for lac repair.

## 2014-04-27 NOTE — ED Provider Notes (Signed)
CSN: [id number]     Arrival date & time 04/27/14  1743 History   First MD Initiated Contact with Patient 04/27/14 1749     Chief Complaint  Patient presents with  . Animal Bite     (Consider location/radiation/quality/duration/timing/severity/associated sxs/prior Treatment) The history is provided by the patient, a friend and the spouse. No language interpreter was used.  Jacqueline Riggs is a 59 y.o white female who presents for sudden onset right hand pain after unprovoked dog bite 30 minutes prior to arrival in the ED.  She rates the pain 8/10. She has not taken any medication prior to arrival.  Nothing makes it better or worse. It is a neighbors dog and has been vaccinated. She denies history of wrist pain, no other injury. She had a tetanus 3-4 years ago.   No past medical history on file. No past surgical history on file. No family history on file. History  Substance Use Topics  . Smoking status: Not on file  . Smokeless tobacco: Not on file  . Alcohol Use: Not on file   OB History    No data available     Review of Systems  Respiratory: Negative for shortness of breath.   Gastrointestinal: Negative for nausea and vomiting.  Musculoskeletal: Negative for joint swelling and arthralgias.  Skin: Positive for wound.  Neurological: Negative for dizziness, syncope and light-headedness.  All other systems reviewed and are negative.     Allergies  Review of patient's allergies indicates not on file.  Home Medications   Prior to Admission medications   Not on File   BP 128/67 mmHg  Pulse 56  Temp(Src) 99 F (37.2 C) (Oral)  Resp 16  SpO2 95% Physical Exam  Constitutional: She is oriented to person, place, and time. She appears well-developed and well-nourished.  HENT:  Head: Normocephalic and atraumatic.  Eyes: Conjunctivae are normal.  Neck: Normal range of motion. Neck supple.  Cardiovascular: Normal rate, regular rhythm and normal heart sounds.   Pulmonary/Chest:  Effort normal and breath sounds normal.  Abdominal: Soft. There is no tenderness.  Musculoskeletal:  Right thumb has a flap laceration without tendon involvement. No teeth could be visualized.  Able to move hand and fingers. Good cap refill in thumb and fingers.  Good sensation and radial pulse.   Neurological: She is alert and oriented to person, place, and time.  Skin: Skin is warm and dry.  Nursing note and vitals reviewed.   ED Course  LACERATION REPAIR Date/Time: 04/27/2014 6:18 PM Performed by: Catha Gosselin Authorized by: Catha Gosselin Consent: Verbal consent obtained. Risks and benefits: risks, benefits and alternatives were discussed Consent given by: patient Patient understanding: patient states understanding of the procedure being performed Patient consent: the patient's understanding of the procedure matches consent given Imaging studies: imaging studies available Patient identity confirmed: verbally with patient Body area: upper extremity Location details: right thumb Laceration length: 6 cm Foreign bodies: no foreign bodies Tendon involvement: none Nerve involvement: none Vascular damage: no Anesthesia: digital block Local anesthetic: lidocaine 1% without epinephrine Anesthetic total: 6 ml Patient sedated: no Irrigation solution: saline Irrigation method: syringe Amount of cleaning: extensive Debridement: none Degree of undermining: none Skin closure: 5-0 Prolene Number of sutures: 5 Technique: simple Approximation: loose Approximation difficulty: simple Dressing: 4x4 sterile gauze Patient tolerance: Patient tolerated the procedure well with no immediate complications   (including critical care time) Labs Review Labs Reviewed - No data to display  Imaging Review No results found.  EKG Interpretation None      MDM   Final diagnoses:  Animal bite   This is a laceration from a dog bite that occurred 30 minutes prior to arrival. Hand  xray shows no evidence of foreign body. The patient had a tetanus 3-4 years ago. I spoke to Dr. Radford Pax regarding the closure of the wound.  He looked at it and suggested loose closure due to the dog bite.  19:33 Dr. Amanda Pea was consulted by Dr. Radford Pax and suggested the patient be put on Cipro as well as Doxy due to the Augmentin allergy. He said he would see the patient in office at 7:30 on 04/29/14 and to call for any complications before that.  I have explained return precautions for increased swelling, erythema, streaking.  She will f/u with Dr. Amanda Pea on Monday. I have given her antibiotics and pain medications.      Catha Gosselin, PA-C 04/28/14 1045  Nelva Nay, MD 04/28/14 [phone]

## 2014-04-27 NOTE — ED Notes (Signed)
Pt states she was taking out the trash and the neighbor's dog bit her on her R hand. Pt has extensive lac with avulsion to R thumb. Bleeding controlled. Pt not on blood thinners. States her tetanus shot was about 3 yrs ago. Dog is up to date on vaccinations.

## 2014-04-27 NOTE — ED Notes (Signed)
Pt has a ride home.  

## 2015-05-21 DIAGNOSIS — F331 Major depressive disorder, recurrent, moderate: Secondary | ICD-10-CM | POA: Diagnosis not present

## 2015-06-02 DIAGNOSIS — M25562 Pain in left knee: Secondary | ICD-10-CM | POA: Diagnosis not present

## 2015-06-02 DIAGNOSIS — M25561 Pain in right knee: Secondary | ICD-10-CM | POA: Diagnosis not present

## 2015-08-13 DIAGNOSIS — F331 Major depressive disorder, recurrent, moderate: Secondary | ICD-10-CM | POA: Diagnosis not present

## 2015-09-03 DIAGNOSIS — K080 Exfoliation of teeth due to systemic causes: Secondary | ICD-10-CM | POA: Diagnosis not present

## 2015-11-05 DIAGNOSIS — E7800 Pure hypercholesterolemia, unspecified: Secondary | ICD-10-CM | POA: Diagnosis not present

## 2015-11-05 DIAGNOSIS — R011 Cardiac murmur, unspecified: Secondary | ICD-10-CM | POA: Diagnosis not present

## 2015-11-10 DIAGNOSIS — R0602 Shortness of breath: Secondary | ICD-10-CM | POA: Diagnosis not present

## 2015-11-10 DIAGNOSIS — I10 Essential (primary) hypertension: Secondary | ICD-10-CM | POA: Diagnosis not present

## 2015-11-10 DIAGNOSIS — J019 Acute sinusitis, unspecified: Secondary | ICD-10-CM | POA: Diagnosis not present

## 2015-11-10 DIAGNOSIS — N183 Chronic kidney disease, stage 3 (moderate): Secondary | ICD-10-CM | POA: Diagnosis not present

## 2015-11-10 DIAGNOSIS — E785 Hyperlipidemia, unspecified: Secondary | ICD-10-CM | POA: Diagnosis not present

## 2015-11-10 DIAGNOSIS — I071 Rheumatic tricuspid insufficiency: Secondary | ICD-10-CM | POA: Diagnosis not present

## 2015-11-10 DIAGNOSIS — E8881 Metabolic syndrome: Secondary | ICD-10-CM | POA: Diagnosis not present

## 2015-11-11 DIAGNOSIS — F331 Major depressive disorder, recurrent, moderate: Secondary | ICD-10-CM | POA: Diagnosis not present

## 2015-11-21 DIAGNOSIS — R0602 Shortness of breath: Secondary | ICD-10-CM | POA: Diagnosis not present

## 2015-11-25 DIAGNOSIS — I340 Nonrheumatic mitral (valve) insufficiency: Secondary | ICD-10-CM | POA: Diagnosis not present

## 2015-11-25 DIAGNOSIS — I351 Nonrheumatic aortic (valve) insufficiency: Secondary | ICD-10-CM | POA: Diagnosis not present

## 2015-12-10 DIAGNOSIS — N183 Chronic kidney disease, stage 3 (moderate): Secondary | ICD-10-CM | POA: Diagnosis not present

## 2015-12-10 DIAGNOSIS — Z6841 Body Mass Index (BMI) 40.0 and over, adult: Secondary | ICD-10-CM | POA: Diagnosis not present

## 2015-12-11 DIAGNOSIS — L02232 Carbuncle of back [any part, except buttock]: Secondary | ICD-10-CM | POA: Diagnosis not present

## 2015-12-16 DIAGNOSIS — E7800 Pure hypercholesterolemia, unspecified: Secondary | ICD-10-CM | POA: Diagnosis not present

## 2015-12-16 DIAGNOSIS — R0602 Shortness of breath: Secondary | ICD-10-CM | POA: Diagnosis not present

## 2015-12-16 DIAGNOSIS — R9439 Abnormal result of other cardiovascular function study: Secondary | ICD-10-CM | POA: Diagnosis not present

## 2016-01-22 DIAGNOSIS — R0602 Shortness of breath: Secondary | ICD-10-CM | POA: Diagnosis not present

## 2016-01-27 DIAGNOSIS — F331 Major depressive disorder, recurrent, moderate: Secondary | ICD-10-CM | POA: Diagnosis not present

## 2016-02-02 DIAGNOSIS — Z01419 Encounter for gynecological examination (general) (routine) without abnormal findings: Secondary | ICD-10-CM | POA: Diagnosis not present

## 2016-02-02 DIAGNOSIS — Z6839 Body mass index (BMI) 39.0-39.9, adult: Secondary | ICD-10-CM | POA: Diagnosis not present

## 2016-02-17 DIAGNOSIS — Z1231 Encounter for screening mammogram for malignant neoplasm of breast: Secondary | ICD-10-CM | POA: Diagnosis not present

## 2016-02-27 DIAGNOSIS — R0602 Shortness of breath: Secondary | ICD-10-CM | POA: Diagnosis not present

## 2016-03-10 DIAGNOSIS — K080 Exfoliation of teeth due to systemic causes: Secondary | ICD-10-CM | POA: Diagnosis not present

## 2016-04-13 DIAGNOSIS — R0602 Shortness of breath: Secondary | ICD-10-CM | POA: Diagnosis not present

## 2016-04-22 DIAGNOSIS — F331 Major depressive disorder, recurrent, moderate: Secondary | ICD-10-CM | POA: Diagnosis not present

## 2016-05-18 DIAGNOSIS — Z79899 Other long term (current) drug therapy: Secondary | ICD-10-CM | POA: Diagnosis not present

## 2016-05-18 DIAGNOSIS — N189 Chronic kidney disease, unspecified: Secondary | ICD-10-CM | POA: Diagnosis not present

## 2016-05-18 DIAGNOSIS — E785 Hyperlipidemia, unspecified: Secondary | ICD-10-CM | POA: Diagnosis not present

## 2016-05-18 DIAGNOSIS — M150 Primary generalized (osteo)arthritis: Secondary | ICD-10-CM | POA: Diagnosis not present

## 2016-06-29 DIAGNOSIS — G4733 Obstructive sleep apnea (adult) (pediatric): Secondary | ICD-10-CM | POA: Diagnosis not present

## 2016-07-21 DIAGNOSIS — G4733 Obstructive sleep apnea (adult) (pediatric): Secondary | ICD-10-CM | POA: Diagnosis not present

## 2016-07-22 DIAGNOSIS — F331 Major depressive disorder, recurrent, moderate: Secondary | ICD-10-CM | POA: Diagnosis not present

## 2016-08-03 DIAGNOSIS — H25013 Cortical age-related cataract, bilateral: Secondary | ICD-10-CM | POA: Diagnosis not present

## 2016-08-03 DIAGNOSIS — Z83511 Family history of glaucoma: Secondary | ICD-10-CM | POA: Diagnosis not present

## 2016-08-03 DIAGNOSIS — H40013 Open angle with borderline findings, low risk, bilateral: Secondary | ICD-10-CM | POA: Diagnosis not present

## 2016-08-03 DIAGNOSIS — H3589 Other specified retinal disorders: Secondary | ICD-10-CM | POA: Diagnosis not present

## 2016-08-03 DIAGNOSIS — H524 Presbyopia: Secondary | ICD-10-CM | POA: Diagnosis not present

## 2016-08-20 DIAGNOSIS — G4733 Obstructive sleep apnea (adult) (pediatric): Secondary | ICD-10-CM | POA: Diagnosis not present

## 2016-09-20 DIAGNOSIS — G4733 Obstructive sleep apnea (adult) (pediatric): Secondary | ICD-10-CM | POA: Diagnosis not present

## 2016-09-22 DIAGNOSIS — G4733 Obstructive sleep apnea (adult) (pediatric): Secondary | ICD-10-CM | POA: Diagnosis not present

## 2016-09-28 DIAGNOSIS — K080 Exfoliation of teeth due to systemic causes: Secondary | ICD-10-CM | POA: Diagnosis not present

## 2016-10-19 DIAGNOSIS — F331 Major depressive disorder, recurrent, moderate: Secondary | ICD-10-CM | POA: Diagnosis not present

## 2016-10-21 DIAGNOSIS — G4733 Obstructive sleep apnea (adult) (pediatric): Secondary | ICD-10-CM | POA: Diagnosis not present

## 2016-11-09 DIAGNOSIS — K080 Exfoliation of teeth due to systemic causes: Secondary | ICD-10-CM | POA: Diagnosis not present

## 2016-11-17 DIAGNOSIS — R2232 Localized swelling, mass and lump, left upper limb: Secondary | ICD-10-CM | POA: Diagnosis not present

## 2016-11-19 DIAGNOSIS — I10 Essential (primary) hypertension: Secondary | ICD-10-CM | POA: Diagnosis not present

## 2016-11-19 DIAGNOSIS — N183 Chronic kidney disease, stage 3 (moderate): Secondary | ICD-10-CM | POA: Diagnosis not present

## 2016-11-19 DIAGNOSIS — G4733 Obstructive sleep apnea (adult) (pediatric): Secondary | ICD-10-CM | POA: Diagnosis not present

## 2016-11-19 DIAGNOSIS — M150 Primary generalized (osteo)arthritis: Secondary | ICD-10-CM | POA: Diagnosis not present

## 2016-11-26 DIAGNOSIS — G4733 Obstructive sleep apnea (adult) (pediatric): Secondary | ICD-10-CM | POA: Diagnosis not present

## 2016-12-13 DIAGNOSIS — N183 Chronic kidney disease, stage 3 (moderate): Secondary | ICD-10-CM | POA: Diagnosis not present

## 2016-12-16 DIAGNOSIS — R2232 Localized swelling, mass and lump, left upper limb: Secondary | ICD-10-CM | POA: Diagnosis not present

## 2016-12-16 DIAGNOSIS — M1812 Unilateral primary osteoarthritis of first carpometacarpal joint, left hand: Secondary | ICD-10-CM | POA: Diagnosis not present

## 2017-01-13 DIAGNOSIS — R2232 Localized swelling, mass and lump, left upper limb: Secondary | ICD-10-CM | POA: Diagnosis not present

## 2017-01-13 DIAGNOSIS — M1812 Unilateral primary osteoarthritis of first carpometacarpal joint, left hand: Secondary | ICD-10-CM | POA: Diagnosis not present

## 2017-01-18 DIAGNOSIS — F331 Major depressive disorder, recurrent, moderate: Secondary | ICD-10-CM | POA: Diagnosis not present

## 2017-03-17 DIAGNOSIS — G4733 Obstructive sleep apnea (adult) (pediatric): Secondary | ICD-10-CM | POA: Diagnosis not present

## 2017-03-30 DIAGNOSIS — Z01419 Encounter for gynecological examination (general) (routine) without abnormal findings: Secondary | ICD-10-CM | POA: Diagnosis not present

## 2017-03-30 DIAGNOSIS — Z6841 Body Mass Index (BMI) 40.0 and over, adult: Secondary | ICD-10-CM | POA: Diagnosis not present

## 2017-03-30 DIAGNOSIS — Z1231 Encounter for screening mammogram for malignant neoplasm of breast: Secondary | ICD-10-CM | POA: Diagnosis not present

## 2017-04-19 DIAGNOSIS — F411 Generalized anxiety disorder: Secondary | ICD-10-CM | POA: Diagnosis not present

## 2017-04-19 DIAGNOSIS — K080 Exfoliation of teeth due to systemic causes: Secondary | ICD-10-CM | POA: Diagnosis not present

## 2017-04-19 DIAGNOSIS — F331 Major depressive disorder, recurrent, moderate: Secondary | ICD-10-CM | POA: Diagnosis not present

## 2017-05-10 DIAGNOSIS — M1812 Unilateral primary osteoarthritis of first carpometacarpal joint, left hand: Secondary | ICD-10-CM | POA: Diagnosis not present

## 2017-05-17 DIAGNOSIS — K080 Exfoliation of teeth due to systemic causes: Secondary | ICD-10-CM | POA: Diagnosis not present

## 2017-05-18 DIAGNOSIS — I10 Essential (primary) hypertension: Secondary | ICD-10-CM | POA: Diagnosis not present

## 2017-05-18 DIAGNOSIS — E785 Hyperlipidemia, unspecified: Secondary | ICD-10-CM | POA: Diagnosis not present

## 2017-05-18 DIAGNOSIS — K219 Gastro-esophageal reflux disease without esophagitis: Secondary | ICD-10-CM | POA: Diagnosis not present

## 2017-05-18 DIAGNOSIS — N183 Chronic kidney disease, stage 3 (moderate): Secondary | ICD-10-CM | POA: Diagnosis not present

## 2017-05-31 DIAGNOSIS — M1812 Unilateral primary osteoarthritis of first carpometacarpal joint, left hand: Secondary | ICD-10-CM | POA: Diagnosis not present

## 2017-05-31 DIAGNOSIS — M19032 Primary osteoarthritis, left wrist: Secondary | ICD-10-CM | POA: Diagnosis not present

## 2017-05-31 DIAGNOSIS — G8918 Other acute postprocedural pain: Secondary | ICD-10-CM | POA: Diagnosis not present

## 2017-06-15 DIAGNOSIS — M1812 Unilateral primary osteoarthritis of first carpometacarpal joint, left hand: Secondary | ICD-10-CM | POA: Diagnosis not present

## 2017-06-29 DIAGNOSIS — M1812 Unilateral primary osteoarthritis of first carpometacarpal joint, left hand: Secondary | ICD-10-CM | POA: Diagnosis not present

## 2017-07-05 DIAGNOSIS — M1812 Unilateral primary osteoarthritis of first carpometacarpal joint, left hand: Secondary | ICD-10-CM | POA: Diagnosis not present

## 2017-07-07 DIAGNOSIS — M1812 Unilateral primary osteoarthritis of first carpometacarpal joint, left hand: Secondary | ICD-10-CM | POA: Diagnosis not present

## 2017-07-13 DIAGNOSIS — M1812 Unilateral primary osteoarthritis of first carpometacarpal joint, left hand: Secondary | ICD-10-CM | POA: Diagnosis not present

## 2017-07-14 DIAGNOSIS — M1812 Unilateral primary osteoarthritis of first carpometacarpal joint, left hand: Secondary | ICD-10-CM | POA: Diagnosis not present

## 2017-07-19 DIAGNOSIS — M1812 Unilateral primary osteoarthritis of first carpometacarpal joint, left hand: Secondary | ICD-10-CM | POA: Diagnosis not present

## 2017-07-19 DIAGNOSIS — F411 Generalized anxiety disorder: Secondary | ICD-10-CM | POA: Diagnosis not present

## 2017-07-19 DIAGNOSIS — F331 Major depressive disorder, recurrent, moderate: Secondary | ICD-10-CM | POA: Diagnosis not present

## 2017-07-21 DIAGNOSIS — M1812 Unilateral primary osteoarthritis of first carpometacarpal joint, left hand: Secondary | ICD-10-CM | POA: Diagnosis not present

## 2017-07-26 DIAGNOSIS — M1812 Unilateral primary osteoarthritis of first carpometacarpal joint, left hand: Secondary | ICD-10-CM | POA: Diagnosis not present

## 2017-08-01 DIAGNOSIS — M1812 Unilateral primary osteoarthritis of first carpometacarpal joint, left hand: Secondary | ICD-10-CM | POA: Diagnosis not present

## 2017-08-04 DIAGNOSIS — M1812 Unilateral primary osteoarthritis of first carpometacarpal joint, left hand: Secondary | ICD-10-CM | POA: Diagnosis not present

## 2017-08-08 DIAGNOSIS — M1812 Unilateral primary osteoarthritis of first carpometacarpal joint, left hand: Secondary | ICD-10-CM | POA: Diagnosis not present

## 2017-08-10 DIAGNOSIS — M1812 Unilateral primary osteoarthritis of first carpometacarpal joint, left hand: Secondary | ICD-10-CM | POA: Diagnosis not present

## 2017-08-15 DIAGNOSIS — M1812 Unilateral primary osteoarthritis of first carpometacarpal joint, left hand: Secondary | ICD-10-CM | POA: Diagnosis not present

## 2017-08-17 DIAGNOSIS — M1812 Unilateral primary osteoarthritis of first carpometacarpal joint, left hand: Secondary | ICD-10-CM | POA: Diagnosis not present

## 2017-08-22 DIAGNOSIS — M1812 Unilateral primary osteoarthritis of first carpometacarpal joint, left hand: Secondary | ICD-10-CM | POA: Diagnosis not present

## 2017-08-24 DIAGNOSIS — M1812 Unilateral primary osteoarthritis of first carpometacarpal joint, left hand: Secondary | ICD-10-CM | POA: Diagnosis not present

## 2017-08-31 DIAGNOSIS — M1812 Unilateral primary osteoarthritis of first carpometacarpal joint, left hand: Secondary | ICD-10-CM | POA: Diagnosis not present

## 2017-09-05 DIAGNOSIS — K080 Exfoliation of teeth due to systemic causes: Secondary | ICD-10-CM | POA: Diagnosis not present

## 2017-09-15 DIAGNOSIS — M1812 Unilateral primary osteoarthritis of first carpometacarpal joint, left hand: Secondary | ICD-10-CM | POA: Diagnosis not present

## 2017-09-21 DIAGNOSIS — G4733 Obstructive sleep apnea (adult) (pediatric): Secondary | ICD-10-CM | POA: Diagnosis not present

## 2017-09-28 DIAGNOSIS — K080 Exfoliation of teeth due to systemic causes: Secondary | ICD-10-CM | POA: Diagnosis not present

## 2017-09-29 DIAGNOSIS — M1812 Unilateral primary osteoarthritis of first carpometacarpal joint, left hand: Secondary | ICD-10-CM | POA: Diagnosis not present

## 2017-10-19 DIAGNOSIS — F331 Major depressive disorder, recurrent, moderate: Secondary | ICD-10-CM | POA: Diagnosis not present

## 2017-10-19 DIAGNOSIS — F411 Generalized anxiety disorder: Secondary | ICD-10-CM | POA: Diagnosis not present

## 2017-10-20 DIAGNOSIS — B349 Viral infection, unspecified: Secondary | ICD-10-CM | POA: Diagnosis not present

## 2017-10-20 DIAGNOSIS — J209 Acute bronchitis, unspecified: Secondary | ICD-10-CM | POA: Diagnosis not present

## 2017-10-26 DIAGNOSIS — M1812 Unilateral primary osteoarthritis of first carpometacarpal joint, left hand: Secondary | ICD-10-CM | POA: Diagnosis not present

## 2017-10-27 DIAGNOSIS — M25552 Pain in left hip: Secondary | ICD-10-CM | POA: Diagnosis not present

## 2017-11-01 DIAGNOSIS — K080 Exfoliation of teeth due to systemic causes: Secondary | ICD-10-CM | POA: Diagnosis not present

## 2017-11-01 DIAGNOSIS — M25552 Pain in left hip: Secondary | ICD-10-CM | POA: Diagnosis not present

## 2017-11-02 DIAGNOSIS — M25552 Pain in left hip: Secondary | ICD-10-CM | POA: Diagnosis not present

## 2017-11-08 DIAGNOSIS — M25552 Pain in left hip: Secondary | ICD-10-CM | POA: Diagnosis not present

## 2017-11-10 DIAGNOSIS — M25552 Pain in left hip: Secondary | ICD-10-CM | POA: Diagnosis not present

## 2017-11-16 DIAGNOSIS — M25552 Pain in left hip: Secondary | ICD-10-CM | POA: Diagnosis not present

## 2017-11-18 DIAGNOSIS — M25552 Pain in left hip: Secondary | ICD-10-CM | POA: Diagnosis not present

## 2017-11-22 DIAGNOSIS — M25552 Pain in left hip: Secondary | ICD-10-CM | POA: Diagnosis not present

## 2017-11-22 DIAGNOSIS — I10 Essential (primary) hypertension: Secondary | ICD-10-CM | POA: Diagnosis not present

## 2017-11-24 DIAGNOSIS — M25552 Pain in left hip: Secondary | ICD-10-CM | POA: Diagnosis not present

## 2017-12-06 DIAGNOSIS — M25552 Pain in left hip: Secondary | ICD-10-CM | POA: Diagnosis not present

## 2017-12-12 DIAGNOSIS — M25552 Pain in left hip: Secondary | ICD-10-CM | POA: Diagnosis not present

## 2017-12-15 DIAGNOSIS — M1612 Unilateral primary osteoarthritis, left hip: Secondary | ICD-10-CM | POA: Diagnosis not present

## 2017-12-19 DIAGNOSIS — Z6841 Body Mass Index (BMI) 40.0 and over, adult: Secondary | ICD-10-CM | POA: Diagnosis not present

## 2017-12-19 DIAGNOSIS — M1612 Unilateral primary osteoarthritis, left hip: Secondary | ICD-10-CM | POA: Diagnosis not present

## 2017-12-23 DIAGNOSIS — N289 Disorder of kidney and ureter, unspecified: Secondary | ICD-10-CM | POA: Diagnosis not present

## 2017-12-23 DIAGNOSIS — M25552 Pain in left hip: Secondary | ICD-10-CM | POA: Diagnosis not present

## 2018-01-11 DIAGNOSIS — M1612 Unilateral primary osteoarthritis, left hip: Secondary | ICD-10-CM | POA: Diagnosis not present

## 2018-01-17 DIAGNOSIS — F331 Major depressive disorder, recurrent, moderate: Secondary | ICD-10-CM | POA: Diagnosis not present

## 2018-01-17 DIAGNOSIS — F411 Generalized anxiety disorder: Secondary | ICD-10-CM | POA: Diagnosis not present

## 2018-02-23 DIAGNOSIS — G4733 Obstructive sleep apnea (adult) (pediatric): Secondary | ICD-10-CM | POA: Diagnosis not present

## 2018-03-07 DIAGNOSIS — M25552 Pain in left hip: Secondary | ICD-10-CM | POA: Diagnosis not present

## 2018-03-07 DIAGNOSIS — M1612 Unilateral primary osteoarthritis, left hip: Secondary | ICD-10-CM | POA: Diagnosis not present

## 2018-03-21 DIAGNOSIS — M25552 Pain in left hip: Secondary | ICD-10-CM | POA: Diagnosis not present

## 2018-03-21 DIAGNOSIS — M1612 Unilateral primary osteoarthritis, left hip: Secondary | ICD-10-CM | POA: Diagnosis not present

## 2018-03-24 DIAGNOSIS — I10 Essential (primary) hypertension: Secondary | ICD-10-CM | POA: Diagnosis not present

## 2018-03-24 DIAGNOSIS — E782 Mixed hyperlipidemia: Secondary | ICD-10-CM | POA: Diagnosis not present

## 2018-03-24 DIAGNOSIS — K219 Gastro-esophageal reflux disease without esophagitis: Secondary | ICD-10-CM | POA: Diagnosis not present

## 2018-04-04 DIAGNOSIS — M24152 Other articular cartilage disorders, left hip: Secondary | ICD-10-CM | POA: Diagnosis not present

## 2018-04-04 DIAGNOSIS — M25552 Pain in left hip: Secondary | ICD-10-CM | POA: Diagnosis not present

## 2018-04-20 DIAGNOSIS — E876 Hypokalemia: Secondary | ICD-10-CM | POA: Diagnosis not present

## 2018-04-24 DIAGNOSIS — F411 Generalized anxiety disorder: Secondary | ICD-10-CM | POA: Diagnosis not present

## 2018-04-24 DIAGNOSIS — F331 Major depressive disorder, recurrent, moderate: Secondary | ICD-10-CM | POA: Diagnosis not present

## 2018-04-25 DIAGNOSIS — M25552 Pain in left hip: Secondary | ICD-10-CM | POA: Diagnosis not present

## 2018-04-25 DIAGNOSIS — M1612 Unilateral primary osteoarthritis, left hip: Secondary | ICD-10-CM | POA: Diagnosis not present

## 2018-05-19 DIAGNOSIS — G4730 Sleep apnea, unspecified: Secondary | ICD-10-CM | POA: Diagnosis not present

## 2018-05-19 DIAGNOSIS — E785 Hyperlipidemia, unspecified: Secondary | ICD-10-CM | POA: Diagnosis not present

## 2018-05-19 DIAGNOSIS — N183 Chronic kidney disease, stage 3 (moderate): Secondary | ICD-10-CM | POA: Diagnosis not present

## 2018-05-19 DIAGNOSIS — I10 Essential (primary) hypertension: Secondary | ICD-10-CM | POA: Diagnosis not present

## 2018-05-19 DIAGNOSIS — E559 Vitamin D deficiency, unspecified: Secondary | ICD-10-CM | POA: Diagnosis not present

## 2018-06-20 ENCOUNTER — Other Ambulatory Visit: Payer: Self-pay | Admitting: Orthopedic Surgery

## 2018-06-22 ENCOUNTER — Other Ambulatory Visit: Payer: Self-pay | Admitting: Orthopedic Surgery

## 2018-06-23 NOTE — Patient Instructions (Addendum)
Jacqueline Riggs  06/23/2018   Your procedure is scheduled on: 06-30-18    Report to [facility] Main  Entrance    Report to Admitting at 7:24 AM    Call this number if you have problems the morning of surgery [phone]    Remember: NO SOLID FOOD AFTER MIDNIGHT THE NIGHT PRIOR TO SURGERY. NOTHING BY MOUTH EXCEPT CLEAR LIQUIDS UNTIL 3 HOURS PRIOR TO SCHEDULED SURGERY. PLEASE FINISH ENSURE DRINK PER SURGEON ORDER, BY 4:30 AM .    CLEAR LIQUID DIET   Foods Allowed                                                                     Foods Excluded  Coffee and tea, regular and decaf                             liquids that you cannot  Plain Jell-O in any flavor                                             see through such as: Fruit ices (not with fruit pulp)                                     milk, soups, orange juice  Iced Popsicles                                    All solid food Carbonated beverages, regular and diet                                    Cranberry, grape and apple juices Sports drinks like Gatorade Lightly seasoned clear broth or consume(fat free) Sugar, honey syrup  Sample Menu Breakfast                                Lunch                                     Supper Cranberry juice                    Beef broth                            Chicken broth Jell-O                                     Grape juice  Apple juice Coffee or tea                        Jell-O                                      Popsicle                                                Coffee or tea                        Coffee or tea  _____________________________________________________________________      BRUSH YOUR TEETH MORNING OF SURGERY AND RINSE YOUR MOUTH OUT, NO CHEWING GUM CANDY OR MINTS.     Take these medicines the morning of surgery with A SIP OF WATER: Amlodipine (Norvasc), Famotidine (Pepcid), and Labetalol (Normodyne)                         You may not have any metal on your body including hair pins and              piercings     Do not wear jewelry, make-up, lotions, powders or perfumes, deodorant              Do not wear nail polish.  Do not shave  48 hours prior to surgery.          Do not bring valuables to the hospital. [facility] IS NOT             RESPONSIBLE   FOR VALUABLES.  Contacts, dentures or bridgework may not be worn into surgery. Marland Kitchen   Special Instructions: N/A              Please read over the following fact sheets you were given: _____________________________________________________________________             [facility] - Preparing for Surgery Before surgery, you can play an important role.  Because skin is not sterile, your skin needs to be as free of germs as possible.  You can reduce the number of germs on your skin by washing with CHG (chlorahexidine gluconate) soap before surgery.  CHG is an antiseptic cleaner which kills germs and bonds with the skin to continue killing germs even after washing. Please DO NOT use if you have an allergy to CHG or antibacterial soaps.  If your skin becomes reddened/irritated stop using the CHG and inform your nurse when you arrive at Short Stay. Do not shave (including legs and underarms) for at least 48 hours prior to the first CHG shower.  You may shave your face/neck. Please follow these instructions carefully:  1.  Shower with CHG Soap the night before surgery and the  morning of Surgery.  2.  If you choose to wash your hair, wash your hair first as usual with your  normal  shampoo.  3.  After you shampoo, rinse your hair and body thoroughly to remove the  shampoo.                           4.  Use CHG as you would any other liquid  soap.  You can apply chg directly  to the skin and wash                       Gently with a scrungie or clean washcloth.  5.  Apply the CHG Soap to your body ONLY FROM THE NECK DOWN.   Do not use on face/ open                            Wound or open sores. Avoid contact with eyes, ears mouth and genitals (private parts).                       Wash face,  Genitals (private parts) with your normal soap.             6.  Wash thoroughly, paying special attention to the area where your surgery  will be performed.  7.  Thoroughly rinse your body with warm water from the neck down.  8.  DO NOT shower/wash with your normal soap after using and rinsing off  the CHG Soap.                9.  Pat yourself dry with a clean towel.            10.  Wear clean pajamas.            11.  Place clean sheets on your bed the night of your first shower and do not  sleep with pets. Day of Surgery : Do not apply any lotions/deodorants the morning of surgery.  Please wear clean clothes to the hospital/surgery center.  FAILURE TO FOLLOW THESE INSTRUCTIONS MAY RESULT IN THE CANCELLATION OF YOUR SURGERY PATIENT SIGNATURE_________________________________  NURSE SIGNATURE__________________________________  ________________________________________________________________________   Jacqueline Riggs  An incentive spirometer is a tool that can help keep your lungs clear and active. This tool measures how well you are filling your lungs with each breath. Taking long deep breaths may help reverse or decrease the chance of developing breathing (pulmonary) problems (especially infection) following:  A long period of time when you are unable to move or be active. BEFORE THE PROCEDURE   If the spirometer includes an indicator to show your best effort, your nurse or respiratory therapist will set it to a desired goal.  If possible, sit up straight or lean slightly forward. Try not to slouch.  Hold the incentive spirometer in an upright position. INSTRUCTIONS FOR USE  1. Sit on the edge of your bed if possible, or sit up as far as you can in bed or on a chair. 2. Hold the incentive spirometer in an upright position. 3. Breathe out  normally. 4. Place the mouthpiece in your mouth and seal your lips tightly around it. 5. Breathe in slowly and as deeply as possible, raising the piston or the ball toward the top of the column. 6. Hold your breath for 3-5 seconds or for as long as possible. Allow the piston or ball to fall to the bottom of the column. 7. Remove the mouthpiece from your mouth and breathe out normally. 8. Rest for a few seconds and repeat Steps 1 through 7 at least 10 times every 1-2 hours when you are awake. Take your time and take a few normal breaths between deep breaths. 9. The spirometer may include an indicator to show your best effort. Use the indicator as a goal  to work toward during each repetition. 10. After each set of 10 deep breaths, practice coughing to be sure your lungs are clear. If you have an incision (the cut made at the time of surgery), support your incision when coughing by placing a pillow or rolled up towels firmly against it. Once you are able to get out of bed, walk around indoors and cough well. You may stop using the incentive spirometer when instructed by your caregiver.  RISKS AND COMPLICATIONS  Take your time so you do not get dizzy or light-headed.  If you are in pain, you may need to take or ask for pain medication before doing incentive spirometry. It is harder to take a deep breath if you are having pain. AFTER USE  Rest and breathe slowly and easily.  It can be helpful to keep track of a log of your progress. Your caregiver can provide you with a simple table to help with this. If you are using the spirometer at home, follow these instructions: SEEK MEDICAL CARE IF:   You are having difficultly using the spirometer.  You have trouble using the spirometer as often as instructed.  Your pain medication is not giving enough relief while using the spirometer.  You develop fever of 100.5 F (38.1 C) or higher. SEEK IMMEDIATE MEDICAL CARE IF:   You cough up bloody sputum  that had not been present before.  You develop fever of 102 F (38.9 C) or greater.  You develop worsening pain at or near the incision site. MAKE SURE YOU:   Understand these instructions.  Will watch your condition.  Will get help right away if you are not doing well or get worse. Document Released: 06/14/2006 Document Revised: 04/26/2011 Document Reviewed: 08/15/2006 ExitCare Patient Information 2014 [employer], [place].   ________________________________________________________________________   WHAT IS A BLOOD TRANSFUSION? Blood Transfusion Information  A transfusion is the replacement of blood or some of its parts. Blood is made up of multiple cells which provide different functions.  Red blood cells carry oxygen and are used for blood loss replacement.  White blood cells fight against infection.  Platelets control bleeding.  Plasma helps clot blood.  Other blood products are available for specialized needs, such as hemophilia or other clotting disorders. BEFORE THE TRANSFUSION  Who gives blood for transfusions?   Healthy volunteers who are fully evaluated to make sure their blood is safe. This is blood bank blood. Transfusion therapy is the safest it has ever been in the practice of medicine. Before blood is taken from a donor, a complete history is taken to make sure that person has no history of diseases nor engages in risky social behavior (examples are intravenous drug use or sexual activity with multiple partners). The donor's travel history is screened to minimize risk of transmitting infections, such as malaria. The donated blood is tested for signs of infectious diseases, such as HIV and hepatitis. The blood is then tested to be sure it is compatible with you in order to minimize the chance of a transfusion reaction. If you or a relative donates blood, this is often done in anticipation of surgery and is not appropriate for emergency situations. It takes many days to  process the donated blood. RISKS AND COMPLICATIONS Although transfusion therapy is very safe and saves many lives, the main dangers of transfusion include:   Getting an infectious disease.  Developing a transfusion reaction. This is an allergic reaction to something in the blood you were given. Every  precaution is taken to prevent this. The decision to have a blood transfusion has been considered carefully by your caregiver before blood is given. Blood is not given unless the benefits outweigh the risks. AFTER THE TRANSFUSION  Right after receiving a blood transfusion, you will usually feel much better and more energetic. This is especially true if your red blood cells have gotten low (anemic). The transfusion raises the level of the red blood cells which carry oxygen, and this usually causes an energy increase.  The nurse administering the transfusion will monitor you carefully for complications. HOME CARE INSTRUCTIONS  No special instructions are needed after a transfusion. You may find your energy is better. Speak with your caregiver about any limitations on activity for underlying diseases you may have. SEEK MEDICAL CARE IF:   Your condition is not improving after your transfusion.  You develop redness or irritation at the intravenous (IV) site. SEEK IMMEDIATE MEDICAL CARE IF:  Any of the following symptoms occur over the next 12 hours:  Shaking chills.  You have a temperature by mouth above 102 F (38.9 C), not controlled by medicine.  Chest, back, or muscle pain.  People around you feel you are not acting correctly or are confused.  Shortness of breath or difficulty breathing.  Dizziness and fainting.  You get a rash or develop hives.  You have a decrease in urine output.  Your urine turns a dark color or changes to pink, red, or brown. Any of the following symptoms occur over the next 10 days:  You have a temperature by mouth above 102 F (38.9 C), not controlled by  medicine.  Shortness of breath.  Weakness after normal activity.  The white part of the eye turns yellow (jaundice).  You have a decrease in the amount of urine or are urinating less often.  Your urine turns a dark color or changes to pink, red, or brown. Document Released: 01/30/2000 Document Revised: 04/26/2011 Document Reviewed: 09/18/2007 [facility] Patient Information 2014 [place] [employer], [place].  _______________________________________________________________________

## 2018-06-23 NOTE — Progress Notes (Signed)
SPOKE W/ PT_     SCREENING SYMPTOMS OF COVID 19:   COUGH--Allergy related   RUNNY NOSE--- Allergy related  SORE THROAT---No  NASAL CONGESTION----No  SNEEZING----No  SHORTNESS OF BREATH---No  DIFFICULTY BREATHING---No  TEMP >100.0 -----No  UNEXPLAINED BODY ACHES------No  CHILLS -------- No  HEADACHES ---------No  LOSS OF SMELL/ TASTE --------No    HAVE YOU OR ANY FAMILY MEMBER TRAVELLED PAST 14 DAYS OUT OF THE   COUNTY---Pt traveled from [place] to [place] to assist mother STATE----No COUNTRY----No  HAVE YOU OR ANY FAMILY MEMBER BEEN EXPOSED TO ANYONE WITH COVID 19? No

## 2018-06-26 ENCOUNTER — Other Ambulatory Visit (HOSPITAL_COMMUNITY)
Admission: RE | Admit: 2018-06-26 | Discharge: 2018-06-26 | Disposition: A | Discharge status: Home / Self Care | Payer: Federal, State, Local not specified - PPO | Source: Ambulatory Visit

## 2018-06-26 ENCOUNTER — Encounter (HOSPITAL_COMMUNITY): Payer: Self-pay

## 2018-06-26 ENCOUNTER — Encounter (HOSPITAL_COMMUNITY)
Admission: RE | Admit: 2018-06-26 | Discharge: 2018-06-26 | Disposition: A | Discharge status: Home / Self Care | Payer: Federal, State, Local not specified - PPO | Source: Ambulatory Visit | Attending: Orthopedic Surgery | Admitting: Orthopedic Surgery

## 2018-06-26 ENCOUNTER — Encounter (INDEPENDENT_AMBULATORY_CARE_PROVIDER_SITE_OTHER): Payer: Self-pay

## 2018-06-26 ENCOUNTER — Other Ambulatory Visit: Payer: Self-pay

## 2018-06-26 ENCOUNTER — Ambulatory Visit (HOSPITAL_COMMUNITY)
Admission: RE | Admit: 2018-06-26 | Discharge: 2018-06-26 | Disposition: A | Discharge status: Home / Self Care | Payer: Federal, State, Local not specified - PPO | Source: Ambulatory Visit | Attending: Orthopedic Surgery | Admitting: Orthopedic Surgery

## 2018-06-26 DIAGNOSIS — R9431 Abnormal electrocardiogram [ECG] [EKG]: Secondary | ICD-10-CM | POA: Insufficient documentation

## 2018-06-26 DIAGNOSIS — Z1159 Encounter for screening for other viral diseases: Secondary | ICD-10-CM | POA: Insufficient documentation

## 2018-06-26 DIAGNOSIS — R001 Bradycardia, unspecified: Secondary | ICD-10-CM | POA: Insufficient documentation

## 2018-06-26 DIAGNOSIS — M1612 Unilateral primary osteoarthritis, left hip: Secondary | ICD-10-CM | POA: Insufficient documentation

## 2018-06-26 DIAGNOSIS — Z01811 Encounter for preprocedural respiratory examination: Secondary | ICD-10-CM

## 2018-06-26 DIAGNOSIS — R079 Chest pain, unspecified: Secondary | ICD-10-CM | POA: Diagnosis not present

## 2018-06-26 HISTORY — DX: Chronic kidney disease, unspecified: N18.9

## 2018-06-26 HISTORY — DX: Sleep apnea, unspecified: G47.30

## 2018-06-26 HISTORY — DX: Essential (primary) hypertension: I10

## 2018-06-26 HISTORY — DX: Depression, unspecified: F32.A

## 2018-06-26 HISTORY — DX: Unspecified osteoarthritis, unspecified site: M19.90

## 2018-06-26 LAB — CBC WITH DIFFERENTIAL/PLATELET
Abs Immature Granulocytes: 0.02 10*3/uL (ref 0.00–0.07)
Basophils Absolute: 0.1 10*3/uL (ref 0.0–0.1)
Basophils Relative: 1 %
Eosinophils Absolute: 0.1 10*3/uL (ref 0.0–0.5)
Eosinophils Relative: 1 %
HCT: 46 % (ref 36.0–46.0)
Hemoglobin: 14.6 g/dL (ref 12.0–15.0)
Immature Granulocytes: 0 %
Lymphocytes Relative: 22 %
Lymphs Abs: 1.5 10*3/uL (ref 0.7–4.0)
MCH: 30.2 pg (ref 26.0–34.0)
MCHC: 31.7 g/dL (ref 30.0–36.0)
MCV: 95 fL (ref 80.0–100.0)
Monocytes Absolute: 0.5 10*3/uL (ref 0.1–1.0)
Monocytes Relative: 8 %
Neutro Abs: 4.8 10*3/uL (ref 1.7–7.7)
Neutrophils Relative %: 68 %
Platelets: 221 10*3/uL (ref 150–400)
RBC: 4.84 MIL/uL (ref 3.87–5.11)
RDW: 13.8 % (ref 11.5–15.5)
WBC: 7 10*3/uL (ref 4.0–10.5)
nRBC: 0 % (ref 0.0–0.2)

## 2018-06-26 LAB — COMPREHENSIVE METABOLIC PANEL
ALT: 17 U/L (ref 0–44)
AST: 23 U/L (ref 15–41)
Albumin: 4.5 g/dL (ref 3.5–5.0)
Alkaline Phosphatase: 58 U/L (ref 38–126)
Anion gap: 11 (ref 5–15)
BUN: 17 mg/dL (ref 8–23)
CO2: 28 mmol/L (ref 22–32)
Calcium: 9.4 mg/dL (ref 8.9–10.3)
Chloride: 101 mmol/L (ref 98–111)
Creatinine, Ser: 1.42 mg/dL — ABNORMAL HIGH (ref 0.44–1.00)
GFR calc Af Amer: 45 mL/min — ABNORMAL LOW (ref >60)
GFR calc non Af Amer: 39 mL/min — ABNORMAL LOW (ref >60)
Glucose, Bld: 89 mg/dL (ref 70–99)
Potassium: 4 mmol/L (ref 3.5–5.1)
Sodium: 140 mmol/L (ref 135–145)
Total Bilirubin: 0.8 mg/dL (ref 0.3–1.2)
Total Protein: 7.6 g/dL (ref 6.5–8.1)

## 2018-06-26 LAB — PROTIME-INR
INR: 1.1 (ref 0.8–1.2)
Prothrombin Time: 13.6 seconds (ref 11.4–15.2)

## 2018-06-26 LAB — URINALYSIS, ROUTINE W REFLEX MICROSCOPIC
Glucose, UA: NEGATIVE mg/dL
Hgb urine dipstick: NEGATIVE
Ketones, ur: 5 mg/dL — AB
Leukocytes,Ua: NEGATIVE
Nitrite: NEGATIVE
Protein, ur: NEGATIVE mg/dL
Specific Gravity, Urine: 1.027 (ref 1.005–1.030)
pH: 5 (ref 5.0–8.0)

## 2018-06-26 LAB — SURGICAL PCR SCREEN
MRSA, PCR: NEGATIVE
Staphylococcus aureus: POSITIVE — AB

## 2018-06-26 LAB — APTT: aPTT: 33 seconds (ref 24–36)

## 2018-06-27 LAB — NOVEL CORONAVIRUS, NAA (HOSP ORDER, SEND-OUT TO REF LAB; TAT 18-24 HRS): SARS-CoV-2, NAA: NOT DETECTED

## 2018-06-27 NOTE — Anesthesia Preprocedure Evaluation (Addendum)
Anesthesia Evaluation  Patient identified by MRN, date of birth, ID band Patient awake    Reviewed: Allergy & Precautions, NPO status , Patient's Chart, lab work & pertinent test results  History of Anesthesia Complications Negative for: history of anesthetic complications  Airway Mallampati: II  TM Distance: >3 FB Neck ROM: Full    Dental  (+) Dental Advisory Given, Teeth Intact, Caps   Pulmonary sleep apnea and Continuous Positive Airway Pressure Ventilation , former smoker,    breath sounds clear to auscultation       Cardiovascular hypertension, Pt. on home beta blockers and Pt. on medications (-) angina Rhythm:Regular Rate:Normal     Neuro/Psych PSYCHIATRIC DISORDERS Depression negative neurological ROS     GI/Hepatic negative GI ROS, Neg liver ROS,   Endo/Other   Obesity   Renal/GU CRFRenal disease     Musculoskeletal  (+) Arthritis ,   Abdominal   Peds  Hematology negative hematology ROS (+)   Anesthesia Other Findings   Reproductive/Obstetrics                           Anesthesia Physical Anesthesia Plan  ASA: III  Anesthesia Plan: Spinal   Post-op Pain Management:    Induction:   PONV Risk Score and Plan: 2 and Treatment may vary due to age or medical condition and Propofol infusion  Airway Management Planned: Natural Airway and Simple Face Mask  Additional Equipment: None  Intra-op Plan:   Post-operative Plan:   Informed Consent: I have reviewed the patients History and Physical, chart, labs and discussed the procedure including the risks, benefits and alternatives for the proposed anesthesia with the patient or authorized representative who has indicated his/her understanding and acceptance.       Plan Discussed with: CRNA and Anesthesiologist  Anesthesia Plan Comments: (Labs reviewed, platelets acceptable. Discussed risks and benefits of spinal, including  spinal/epidural hematoma, infection, failed block, and PDPH. Patient expressed understanding and wished to proceed. )      Anesthesia Quick Evaluation

## 2018-06-27 NOTE — Progress Notes (Signed)
Anesthesia Chart Review   Case:  [id number] Date/Time:  06/30/18 0939   Procedure:  TOTAL HIP ARTHROPLASTY ANTERIOR APPROACH (Left )   Anesthesia type:  Spinal   Pre-op diagnosis:  LEFT HIP DEGENERATIVE JOINT DISEASE WITH LATERAL TEAR   Location:  [facility] ROOM 08 / [facility]   Surgeon:  Jodi Geralds, MD      DISCUSSION:63 yo former smoker (30 pack years, quit 02/15/93) with h/o HTN, CKD Stage III, depression, sleep apnea w/CPAP, left hip DJD with lateral tear scheduled for above procedure 06/30/18 with Dr. Jodi Geralds.   Negative COVID19 swab 06/26/18.   Pt can proceed with planned procedure barring acute status change.  VS: BP 122/66   Pulse (!) 50   Temp 36.8 C (Oral)   Resp 16   Ht 5\' 2"  (1.575 m)   Wt 92.8 kg   BMI 37.40 kg/m   PROVIDERS: Henrine Screws, MD is PCP    LABS: Labs reviewed: Acceptable for surgery. (all labs ordered are listed, but only abnormal results are displayed)  Labs Reviewed  SURGICAL PCR SCREEN - Abnormal; Notable for the following components:      Result Value   Staphylococcus aureus POSITIVE (*)    All other components within normal limits  COMPREHENSIVE METABOLIC PANEL - Abnormal; Notable for the following components:   Creatinine, Ser 1.42 (*)    GFR calc non Af Amer 39 (*)    GFR calc Af Amer 45 (*)    All other components within normal limits  URINALYSIS, ROUTINE W REFLEX MICROSCOPIC - Abnormal; Notable for the following components:   Color, Urine AMBER (*)    APPearance HAZY (*)    Bilirubin Urine SMALL (*)    Ketones, ur 5 (*)    All other components within normal limits  APTT  CBC WITH DIFFERENTIAL/PLATELET  PROTIME-INR  TYPE AND SCREEN     IMAGES: Chest Xray 06/26/2018 FINDINGS: The heart size and mediastinal contours are within normal limits. Both lungs are clear. The visualized skeletal structures are unremarkable.  IMPRESSION: No active cardiopulmonary disease.  EKG: 06/26/2018 Rate 46 bpm Sinus bradycardia  Nonspecific ST  abnormality  CV:  Past Medical History:  Diagnosis Date  . Arthritis   . Chronic kidney disease    Stage 3  . Depression   . Hypertension   . Sleep apnea     Past Surgical History:  Procedure Laterality Date  . DG THUMB LEFT HAND Left    replacement  . JOINT REPLACEMENT Left 05/24/1998  . JOINT REPLACEMENT Right 02/23/2005  . KNEE ARTHROSCOPY Left 10/07/1992   08/03/07  . KNEE ARTHROSCOPY Right 12/18/2002  . Left finger trigger release Left 09/22/2005  . LIPOMA EXCISION Right    Flank  . MOUTH SURGERY  06/25/2002  . TOTAL SHOULDER ARTHROPLASTY Right 01/04/2006  . WISDOM TOOTH EXTRACTION  01/1971    MEDICATIONS: . acetaminophen (TYLENOL) 500 MG tablet  . amLODipine (NORVASC) 10 MG tablet  . calcium carbonate (TUMS - DOSED IN MG ELEMENTAL CALCIUM) 500 MG chewable tablet  . cetirizine (ZYRTEC) 10 MG tablet  . Cholecalciferol (VITAMIN D) 50 MCG (2000 UT) tablet  . Estradiol 10 MCG TABS vaginal tablet  . estrogens-methylTEST (ESTRATEST) 1.25-2.5 MG tablet  . famotidine (PEPCID) 20 MG tablet  . labetalol (NORMODYNE) 100 MG tablet  . lamoTRIgine (LAMICTAL) 150 MG tablet  . mometasone (NASONEX) 50 MCG/ACT nasal spray  . nefazodone (SERZONE) 250 MG tablet  . Propylene Glycol (SYSTANE COMPLETE OP)  . rosuvastatin (  CRESTOR) 10 MG tablet  . traMADol (ULTRAM) 50 MG tablet   No current facility-administered medications for this encounter.     Janey Genta [facility] Pre-Surgical Testing [phone] 06/27/18 2:03 PM

## 2018-06-29 MED ORDER — BUPIVACAINE LIPOSOME 1.3 % IJ SUSP
10.0000 mL | Freq: Once | INTRAMUSCULAR | Status: DC
  Filled 2018-06-29: qty 10

## 2018-06-30 ENCOUNTER — Ambulatory Visit (HOSPITAL_COMMUNITY): Payer: Federal, State, Local not specified - PPO | Admitting: Anesthesiology

## 2018-06-30 ENCOUNTER — Encounter (HOSPITAL_COMMUNITY)
Admission: RE | Disposition: A | Discharge status: Home Health | Payer: Self-pay | Source: Home / Self Care | Attending: Orthopedic Surgery

## 2018-06-30 ENCOUNTER — Ambulatory Visit (HOSPITAL_COMMUNITY): Payer: Federal, State, Local not specified - PPO | Admitting: Physician Assistant

## 2018-06-30 ENCOUNTER — Telehealth (HOSPITAL_COMMUNITY): Payer: Self-pay | Admitting: *Deleted

## 2018-06-30 ENCOUNTER — Observation Stay (HOSPITAL_COMMUNITY)
Admission: RE | Admit: 2018-06-30 | Discharge: 2018-07-01 | Disposition: A | Discharge status: Home Health | Payer: Federal, State, Local not specified - PPO | Attending: Orthopedic Surgery | Admitting: Orthopedic Surgery

## 2018-06-30 ENCOUNTER — Encounter (HOSPITAL_COMMUNITY): Payer: Self-pay | Admitting: Certified Registered Nurse Anesthetist

## 2018-06-30 ENCOUNTER — Other Ambulatory Visit: Payer: Self-pay

## 2018-06-30 ENCOUNTER — Ambulatory Visit (HOSPITAL_COMMUNITY): Payer: Federal, State, Local not specified - PPO

## 2018-06-30 DIAGNOSIS — Z881 Allergy status to other antibiotic agents status: Secondary | ICD-10-CM | POA: Insufficient documentation

## 2018-06-30 DIAGNOSIS — G4730 Sleep apnea, unspecified: Secondary | ICD-10-CM | POA: Insufficient documentation

## 2018-06-30 DIAGNOSIS — M25552 Pain in left hip: Secondary | ICD-10-CM | POA: Diagnosis not present

## 2018-06-30 DIAGNOSIS — Z87891 Personal history of nicotine dependence: Secondary | ICD-10-CM | POA: Insufficient documentation

## 2018-06-30 DIAGNOSIS — Z888 Allergy status to other drugs, medicaments and biological substances status: Secondary | ICD-10-CM | POA: Insufficient documentation

## 2018-06-30 DIAGNOSIS — F329 Major depressive disorder, single episode, unspecified: Secondary | ICD-10-CM | POA: Insufficient documentation

## 2018-06-30 DIAGNOSIS — Z880 Allergy status to penicillin: Secondary | ICD-10-CM | POA: Insufficient documentation

## 2018-06-30 DIAGNOSIS — Z96611 Presence of right artificial shoulder joint: Secondary | ICD-10-CM | POA: Insufficient documentation

## 2018-06-30 DIAGNOSIS — I129 Hypertensive chronic kidney disease with stage 1 through stage 4 chronic kidney disease, or unspecified chronic kidney disease: Secondary | ICD-10-CM | POA: Insufficient documentation

## 2018-06-30 DIAGNOSIS — N183 Chronic kidney disease, stage 3 (moderate): Secondary | ICD-10-CM | POA: Diagnosis not present

## 2018-06-30 DIAGNOSIS — S43432A Superior glenoid labrum lesion of left shoulder, initial encounter: Secondary | ICD-10-CM | POA: Diagnosis not present

## 2018-06-30 DIAGNOSIS — Z471 Aftercare following joint replacement surgery: Secondary | ICD-10-CM | POA: Diagnosis not present

## 2018-06-30 DIAGNOSIS — M1612 Unilateral primary osteoarthritis, left hip: Secondary | ICD-10-CM | POA: Diagnosis present

## 2018-06-30 DIAGNOSIS — M24152 Other articular cartilage disorders, left hip: Secondary | ICD-10-CM | POA: Diagnosis not present

## 2018-06-30 DIAGNOSIS — Z419 Encounter for procedure for purposes other than remedying health state, unspecified: Secondary | ICD-10-CM

## 2018-06-30 DIAGNOSIS — Z882 Allergy status to sulfonamides status: Secondary | ICD-10-CM | POA: Diagnosis not present

## 2018-06-30 DIAGNOSIS — X58XXXA Exposure to other specified factors, initial encounter: Secondary | ICD-10-CM | POA: Diagnosis not present

## 2018-06-30 DIAGNOSIS — S73192A Other sprain of left hip, initial encounter: Secondary | ICD-10-CM | POA: Diagnosis not present

## 2018-06-30 DIAGNOSIS — Z96642 Presence of left artificial hip joint: Secondary | ICD-10-CM | POA: Diagnosis not present

## 2018-06-30 HISTORY — PX: TOTAL HIP ARTHROPLASTY: SHX124

## 2018-06-30 LAB — TYPE AND SCREEN
ABO/RH(D): O POS
Antibody Screen: NEGATIVE

## 2018-06-30 SURGERY — ARTHROPLASTY, HIP, TOTAL, ANTERIOR APPROACH
Anesthesia: Spinal | Laterality: Left

## 2018-06-30 MED ORDER — GABAPENTIN 300 MG PO CAPS
300.0000 mg | ORAL_CAPSULE | Freq: Two times a day (BID) | ORAL | Status: DC
  Administered 2018-06-30 – 2018-07-01 (×3): 300 mg via ORAL
  Filled 2018-06-30 (×3): qty 1

## 2018-06-30 MED ORDER — EPHEDRINE 5 MG/ML INJ
INTRAVENOUS | Status: AC
  Filled 2018-06-30: qty 10

## 2018-06-30 MED ORDER — ASPIRIN EC 325 MG PO TBEC: 325.0000 mg | DELAYED_RELEASE_TABLET | Freq: Two times a day (BID) | ORAL | 0 refills | Status: DC

## 2018-06-30 MED ORDER — ZOLPIDEM TARTRATE 5 MG PO TABS: 5.0000 mg | ORAL_TABLET | Freq: Every evening | ORAL | Status: DC | PRN

## 2018-06-30 MED ORDER — LORATADINE 10 MG PO TABS
10.0000 mg | ORAL_TABLET | Freq: Every day | ORAL | Status: DC
  Administered 2018-07-01: 08:00:00 10 mg via ORAL
  Filled 2018-06-30: qty 1

## 2018-06-30 MED ORDER — ROSUVASTATIN CALCIUM 10 MG PO TABS
10.0000 mg | ORAL_TABLET | Freq: Every day | ORAL | Status: DC
  Administered 2018-06-30: 22:00:00 10 mg via ORAL
  Filled 2018-06-30: qty 1

## 2018-06-30 MED ORDER — MIDAZOLAM HCL 2 MG/2ML IJ SOLN
INTRAMUSCULAR | Status: AC
  Filled 2018-06-30: qty 2

## 2018-06-30 MED ORDER — FENTANYL CITRATE (PF) 100 MCG/2ML IJ SOLN: 25.0000 ug | INTRAMUSCULAR | Status: DC | PRN

## 2018-06-30 MED ORDER — ONDANSETRON HCL 4 MG/2ML IJ SOLN: 4.0000 mg | Freq: Four times a day (QID) | INTRAMUSCULAR | Status: DC | PRN

## 2018-06-30 MED ORDER — METHOCARBAMOL 500 MG IVPB - SIMPLE MED
500.0000 mg | Freq: Four times a day (QID) | INTRAVENOUS | Status: DC | PRN
  Filled 2018-06-30: qty 50

## 2018-06-30 MED ORDER — OXYCODONE HCL 5 MG PO TABS
5.0000 mg | ORAL_TABLET | ORAL | Status: DC | PRN
  Administered 2018-06-30 (×2): 10 mg via ORAL
  Administered 2018-07-01 (×2): 5 mg via ORAL
  Filled 2018-06-30: qty 1
  Filled 2018-06-30: qty 2
  Filled 2018-06-30: qty 1
  Filled 2018-06-30: qty 2

## 2018-06-30 MED ORDER — CHLORHEXIDINE GLUCONATE 4 % EX LIQD: 60.0000 mL | Freq: Once | CUTANEOUS | Status: DC

## 2018-06-30 MED ORDER — HYDROMORPHONE HCL 1 MG/ML IJ SOLN
1.0000 mg | INTRAMUSCULAR | Status: DC | PRN
  Administered 2018-06-30: 17:00:00 1 mg via INTRAVENOUS
  Filled 2018-06-30: qty 1

## 2018-06-30 MED ORDER — LAMOTRIGINE 25 MG PO TABS
150.0000 mg | ORAL_TABLET | Freq: Every day | ORAL | Status: DC
  Administered 2018-06-30: 150 mg via ORAL
  Filled 2018-06-30: qty 1

## 2018-06-30 MED ORDER — SODIUM CHLORIDE (PF) 0.9 % IJ SOLN
INTRAMUSCULAR | Status: AC
  Filled 2018-06-30: qty 20

## 2018-06-30 MED ORDER — PROPOFOL 500 MG/50ML IV EMUL
INTRAVENOUS | Status: DC | PRN
  Administered 2018-06-30: 50 ug/kg/min via INTRAVENOUS

## 2018-06-30 MED ORDER — BUPIVACAINE IN DEXTROSE 0.75-8.25 % IT SOLN
INTRATHECAL | Status: DC | PRN
  Administered 2018-06-30: 1.6 mL via INTRATHECAL

## 2018-06-30 MED ORDER — MAGNESIUM CITRATE PO SOLN: 1.0000 | Freq: Once | ORAL | Status: DC | PRN

## 2018-06-30 MED ORDER — BUPIVACAINE LIPOSOME 1.3 % IJ SUSP
INTRAMUSCULAR | Status: DC | PRN
  Administered 2018-06-30: 10 mL

## 2018-06-30 MED ORDER — FENTANYL CITRATE (PF) 100 MCG/2ML IJ SOLN
INTRAMUSCULAR | Status: AC
  Filled 2018-06-30: qty 2

## 2018-06-30 MED ORDER — DOCUSATE SODIUM 100 MG PO CAPS: 100.0000 mg | ORAL_CAPSULE | Freq: Two times a day (BID) | ORAL | 0 refills | Status: DC

## 2018-06-30 MED ORDER — ONDANSETRON HCL 4 MG PO TABS: 4.0000 mg | ORAL_TABLET | Freq: Four times a day (QID) | ORAL | Status: DC | PRN

## 2018-06-30 MED ORDER — TIZANIDINE HCL 2 MG PO TABS: 2.0000 mg | ORAL_TABLET | Freq: Three times a day (TID) | ORAL | 0 refills | Status: DC | PRN

## 2018-06-30 MED ORDER — DOCUSATE SODIUM 100 MG PO CAPS
100.0000 mg | ORAL_CAPSULE | Freq: Two times a day (BID) | ORAL | Status: DC
  Administered 2018-06-30 – 2018-07-01 (×2): 100 mg via ORAL
  Filled 2018-06-30 (×2): qty 1

## 2018-06-30 MED ORDER — TRANEXAMIC ACID-NACL 1000-0.7 MG/100ML-% IV SOLN
1000.0000 mg | INTRAVENOUS | Status: AC
  Administered 2018-06-30: 1000 mg via INTRAVENOUS
  Filled 2018-06-30: qty 100

## 2018-06-30 MED ORDER — BISACODYL 5 MG PO TBEC: 5.0000 mg | DELAYED_RELEASE_TABLET | Freq: Every day | ORAL | Status: DC | PRN

## 2018-06-30 MED ORDER — WATER FOR IRRIGATION, STERILE IR SOLN
Status: DC | PRN
  Administered 2018-06-30: 1000 mL

## 2018-06-30 MED ORDER — PROPOFOL 10 MG/ML IV BOLUS
INTRAVENOUS | Status: AC
  Filled 2018-06-30: qty 80

## 2018-06-30 MED ORDER — CEFAZOLIN SODIUM-DEXTROSE 2-4 GM/100ML-% IV SOLN
2.0000 g | INTRAVENOUS | Status: AC
  Administered 2018-06-30: 2 g via INTRAVENOUS
  Filled 2018-06-30: qty 100

## 2018-06-30 MED ORDER — LIDOCAINE HCL (CARDIAC) PF 100 MG/5ML IV SOSY
PREFILLED_SYRINGE | INTRAVENOUS | Status: DC | PRN
  Administered 2018-06-30: 40 mg via INTRAVENOUS

## 2018-06-30 MED ORDER — OXYCODONE-ACETAMINOPHEN 5-325 MG PO TABS: 1.0000 | ORAL_TABLET | Freq: Four times a day (QID) | ORAL | 0 refills | Status: DC | PRN

## 2018-06-30 MED ORDER — CEFAZOLIN SODIUM-DEXTROSE 2-4 GM/100ML-% IV SOLN
2.0000 g | Freq: Four times a day (QID) | INTRAVENOUS | Status: AC
  Administered 2018-06-30 (×2): 2 g via INTRAVENOUS
  Filled 2018-06-30 (×2): qty 100

## 2018-06-30 MED ORDER — DEXAMETHASONE SODIUM PHOSPHATE 10 MG/ML IJ SOLN
10.0000 mg | Freq: Two times a day (BID) | INTRAMUSCULAR | Status: AC
  Administered 2018-06-30 – 2018-07-01 (×3): 10 mg via INTRAVENOUS
  Filled 2018-06-30 (×3): qty 1

## 2018-06-30 MED ORDER — LACTATED RINGERS IV SOLN
INTRAVENOUS | Status: DC
  Administered 2018-06-30 (×2): via INTRAVENOUS

## 2018-06-30 MED ORDER — SODIUM CHLORIDE 0.9 % IR SOLN
Status: DC | PRN
  Administered 2018-06-30: 1000 mL

## 2018-06-30 MED ORDER — NEFAZODONE HCL 50 MG PO TABS
250.0000 mg | ORAL_TABLET | Freq: Two times a day (BID) | ORAL | Status: DC
  Filled 2018-06-30: qty 1

## 2018-06-30 MED ORDER — ONDANSETRON HCL 4 MG/2ML IJ SOLN: 4.0000 mg | Freq: Once | INTRAMUSCULAR | Status: DC | PRN

## 2018-06-30 MED ORDER — SODIUM CHLORIDE 0.9 % IV SOLN
INTRAVENOUS | Status: DC
  Administered 2018-06-30 (×2): via INTRAVENOUS

## 2018-06-30 MED ORDER — FAMOTIDINE 20 MG PO TABS
20.0000 mg | ORAL_TABLET | Freq: Two times a day (BID) | ORAL | Status: DC
  Administered 2018-06-30 – 2018-07-01 (×2): 20 mg via ORAL
  Filled 2018-06-30 (×2): qty 1

## 2018-06-30 MED ORDER — EPHEDRINE SULFATE-NACL 50-0.9 MG/10ML-% IV SOSY
PREFILLED_SYRINGE | INTRAVENOUS | Status: DC | PRN
  Administered 2018-06-30 (×4): 5 mg via INTRAVENOUS

## 2018-06-30 MED ORDER — NEFAZODONE HCL 100 MG PO TABS
250.0000 mg | ORAL_TABLET | Freq: Two times a day (BID) | ORAL | Status: DC
  Administered 2018-06-30 – 2018-07-01 (×2): 250 mg via ORAL
  Filled 2018-06-30 (×3): qty 3

## 2018-06-30 MED ORDER — ACETAMINOPHEN 325 MG PO TABS
325.0000 mg | ORAL_TABLET | Freq: Four times a day (QID) | ORAL | Status: DC | PRN
  Administered 2018-07-01: 10:00:00 650 mg via ORAL
  Filled 2018-06-30: qty 2

## 2018-06-30 MED ORDER — POLYETHYLENE GLYCOL 3350 17 G PO PACK: 17.0000 g | PACK | Freq: Every day | ORAL | Status: DC | PRN

## 2018-06-30 MED ORDER — LABETALOL HCL 100 MG PO TABS
100.0000 mg | ORAL_TABLET | Freq: Two times a day (BID) | ORAL | Status: DC
  Administered 2018-06-30: 22:00:00 100 mg via ORAL
  Filled 2018-06-30 (×2): qty 1

## 2018-06-30 MED ORDER — BUPIVACAINE-EPINEPHRINE (PF) 0.5% -1:200000 IJ SOLN
INTRAMUSCULAR | Status: AC
  Filled 2018-06-30: qty 30

## 2018-06-30 MED ORDER — FENTANYL CITRATE (PF) 100 MCG/2ML IJ SOLN
INTRAMUSCULAR | Status: DC | PRN
  Administered 2018-06-30: 25 ug via INTRAVENOUS
  Administered 2018-06-30: 50 ug via INTRAVENOUS
  Administered 2018-06-30: 25 ug via INTRAVENOUS

## 2018-06-30 MED ORDER — MIDAZOLAM HCL 2 MG/2ML IJ SOLN
INTRAMUSCULAR | Status: DC | PRN
  Administered 2018-06-30: 2 mg via INTRAVENOUS

## 2018-06-30 MED ORDER — LIDOCAINE 2% (20 MG/ML) 5 ML SYRINGE
INTRAMUSCULAR | Status: AC
  Filled 2018-06-30: qty 5

## 2018-06-30 MED ORDER — DIPHENHYDRAMINE HCL 12.5 MG/5ML PO ELIX: 12.5000 mg | ORAL_SOLUTION | ORAL | Status: DC | PRN

## 2018-06-30 MED ORDER — HYDROMORPHONE HCL 1 MG/ML IJ SOLN
1.0000 mg | Freq: Once | INTRAMUSCULAR | Status: AC
  Administered 2018-06-30: 16:00:00 1 mg via INTRAVENOUS
  Filled 2018-06-30: qty 1

## 2018-06-30 MED ORDER — 0.9 % SODIUM CHLORIDE (POUR BTL) OPTIME
TOPICAL | Status: DC | PRN
  Administered 2018-06-30: 1000 mL

## 2018-06-30 MED ORDER — ASPIRIN EC 325 MG PO TBEC
325.0000 mg | DELAYED_RELEASE_TABLET | Freq: Two times a day (BID) | ORAL | Status: DC
  Administered 2018-07-01: 08:00:00 325 mg via ORAL
  Filled 2018-06-30: qty 1

## 2018-06-30 MED ORDER — HYDROMORPHONE HCL 1 MG/ML IJ SOLN
0.5000 mg | INTRAMUSCULAR | Status: DC | PRN
  Administered 2018-06-30: 13:00:00 1 mg via INTRAVENOUS
  Filled 2018-06-30: qty 1

## 2018-06-30 MED ORDER — OXYCODONE HCL 5 MG PO TABS: 5.0000 mg | ORAL_TABLET | Freq: Once | ORAL | Status: DC | PRN

## 2018-06-30 MED ORDER — METHOCARBAMOL 500 MG PO TABS
500.0000 mg | ORAL_TABLET | Freq: Four times a day (QID) | ORAL | Status: DC | PRN
  Administered 2018-06-30 – 2018-07-01 (×3): 500 mg via ORAL
  Filled 2018-06-30 (×3): qty 1

## 2018-06-30 MED ORDER — TRAMADOL HCL 50 MG PO TABS
50.0000 mg | ORAL_TABLET | Freq: Four times a day (QID) | ORAL | Status: DC
  Administered 2018-06-30 – 2018-07-01 (×4): 50 mg via ORAL
  Filled 2018-06-30 (×4): qty 1

## 2018-06-30 MED ORDER — AMLODIPINE BESYLATE 10 MG PO TABS
10.0000 mg | ORAL_TABLET | Freq: Every day | ORAL | Status: DC
  Administered 2018-07-01: 08:00:00 10 mg via ORAL
  Filled 2018-06-30: qty 1

## 2018-06-30 MED ORDER — BUPIVACAINE-EPINEPHRINE 0.5% -1:200000 IJ SOLN
INTRAMUSCULAR | Status: DC | PRN
  Administered 2018-06-30: 30 mL

## 2018-06-30 MED ORDER — ALUM & MAG HYDROXIDE-SIMETH 200-200-20 MG/5ML PO SUSP: 30.0000 mL | ORAL | Status: DC | PRN

## 2018-06-30 MED ORDER — POVIDONE-IODINE 10 % EX SWAB
2.0000 "application " | Freq: Once | CUTANEOUS | Status: AC
  Administered 2018-06-30: 2 via TOPICAL

## 2018-06-30 MED ORDER — TRANEXAMIC ACID-NACL 1000-0.7 MG/100ML-% IV SOLN
1000.0000 mg | Freq: Once | INTRAVENOUS | Status: AC
  Administered 2018-06-30: 15:00:00 1000 mg via INTRAVENOUS
  Filled 2018-06-30: qty 100

## 2018-06-30 MED ORDER — FLUTICASONE PROPIONATE 50 MCG/ACT NA SUSP
1.0000 | Freq: Every day | NASAL | Status: DC
  Administered 2018-06-30 – 2018-07-01 (×2): 1 via NASAL
  Filled 2018-06-30: qty 16

## 2018-06-30 MED ORDER — NEFAZODONE HCL 50 MG PO TABS: 250.0000 mg | ORAL_TABLET | Freq: Two times a day (BID) | ORAL | Status: DC

## 2018-06-30 MED ORDER — EST ESTROGENS-METHYLTEST 1.25-2.5 MG PO TABS: 1.0000 | ORAL_TABLET | Freq: Every day | ORAL | Status: DC

## 2018-06-30 MED ORDER — OXYCODONE HCL 5 MG/5ML PO SOLN: 5.0000 mg | Freq: Once | ORAL | Status: DC | PRN

## 2018-06-30 SURGICAL SUPPLY — 42 items
APL PRP STRL LF DISP 70% ISPRP (MISCELLANEOUS) ×1
APL SKNCLS STERI-STRIP NONHPOA (GAUZE/BANDAGES/DRESSINGS) ×1
BAG SPEC THK2 15X12 ZIP CLS (MISCELLANEOUS)
BAG ZIPLOCK 12X15 (MISCELLANEOUS) IMPLANT
BENZOIN TINCTURE PRP APPL 2/3 (GAUZE/BANDAGES/DRESSINGS) ×1 IMPLANT
BLADE SAW SGTL 18X1.27X75 (BLADE) ×2 IMPLANT
BLADE SURG SZ10 CARB STEEL (BLADE) ×4 IMPLANT
CHLORAPREP W/TINT 26 (MISCELLANEOUS) ×2 IMPLANT
CLSR STERI-STRIP ANTIMIC 1/2X4 (GAUZE/BANDAGES/DRESSINGS) ×1 IMPLANT
COVER PERINEAL POST (MISCELLANEOUS) ×2 IMPLANT
COVER SURGICAL LIGHT HANDLE (MISCELLANEOUS) ×2 IMPLANT
COVER WAND RF STERILE (DRAPES) IMPLANT
DRAPE STERI IOBAN 125X83 (DRAPES) ×2 IMPLANT
DRAPE U-SHAPE 47X51 STRL (DRAPES) ×4 IMPLANT
DRSG AQUACEL AG ADV 3.5X 6 (GAUZE/BANDAGES/DRESSINGS) ×2 IMPLANT
ELECT BLADE TIP CTD 4 INCH (ELECTRODE) ×2 IMPLANT
ELECT REM PT RETURN 15FT ADLT (MISCELLANEOUS) ×2 IMPLANT
ELIMINATOR HOLE APEX DEPUY (Hips) ×1 IMPLANT
GAUZE XEROFORM 1X8 LF (GAUZE/BANDAGES/DRESSINGS) IMPLANT
GLOVE BIOGEL PI IND STRL 8 (GLOVE) ×2 IMPLANT
GLOVE BIOGEL PI INDICATOR 8 (GLOVE) ×2
GLOVE ECLIPSE 7.5 STRL STRAW (GLOVE) ×4 IMPLANT
GOWN STRL REUS W/TWL XL LVL3 (GOWN DISPOSABLE) ×4 IMPLANT
HEAD FEMORAL 32 CERAMIC (Hips) ×1 IMPLANT
HOLDER FOLEY CATH W/STRAP (MISCELLANEOUS) ×2 IMPLANT
HOOD PEEL AWAY FLYTE STAYCOOL (MISCELLANEOUS) ×4 IMPLANT
KIT TURNOVER KIT A (KITS) ×1 IMPLANT
LINER ACETABULAR 32X50 (Liner) ×1 IMPLANT
LINER PINN ACET GRIP 50X100 ×1 IMPLANT
NEEDLE HYPO 22GX1.5 SAFETY (NEEDLE) ×2 IMPLANT
PACK ANTERIOR HIP CUSTOM (KITS) ×2 IMPLANT
STAPLER VISISTAT 35W (STAPLE) IMPLANT
STEM CORAIL KA09 (Stem) ×1 IMPLANT
STRIP CLOSURE SKIN 1/2X4 (GAUZE/BANDAGES/DRESSINGS) ×1 IMPLANT
SUT ETHIBOND NAB CT1 #1 30IN (SUTURE) ×4 IMPLANT
SUT MNCRL AB 3-0 PS2 18 (SUTURE) ×1 IMPLANT
SUT VIC AB 0 CT1 36 (SUTURE) ×3 IMPLANT
SUT VIC AB 1 CT1 36 (SUTURE) ×2 IMPLANT
SUT VIC AB 2-0 CT1 27 (SUTURE) ×2
SUT VIC AB 2-0 CT1 TAPERPNT 27 (SUTURE) IMPLANT
TRAY FOLEY MTR SLVR 16FR STAT (SET/KITS/TRAYS/PACK) ×2 IMPLANT
YANKAUER SUCT BULB TIP 10FT TU (MISCELLANEOUS) ×2 IMPLANT

## 2018-06-30 NOTE — Brief Op Note (Signed)
06/30/2018  11:42 AM  PATIENT:  Jacqueline Riggs  63 y.o. female  PRE-OPERATIVE DIAGNOSIS:  LEFT HIP DEGENERATIVE JOINT DISEASE WITH LATERAL TEAR  POST-OPERATIVE DIAGNOSIS:  LEFT HIP DEGENERATIVE JOINT DISEASE WITH LATERAL TEAR  PROCEDURE:  Procedure(s): TOTAL HIP ARTHROPLASTY ANTERIOR APPROACH (Left)  SURGEON:  Surgeon(s) and Role:    Jodi Geralds, MD - Primary  PHYSICIAN ASSISTANT:   ASSISTANTS: jim bethune   ANESTHESIA:   spinal  EBL:  200 mL   BLOOD ADMINISTERED:none  DRAINS: none   LOCAL MEDICATIONS USED:  MARCAINE    and OTHER experel  SPECIMEN:  No Specimen  DISPOSITION OF SPECIMEN:  N/A  COUNTS:  YES  TOURNIQUET:  * No tourniquets in log *  DICTATION: .Other Dictation: Dictation Number [id number]  PLAN OF CARE: Admit to inpatient   PATIENT DISPOSITION:  PACU - hemodynamically stable.   Delay start of Pharmacological VTE agent (>24hrs) due to surgical blood loss or risk of bleeding: no

## 2018-06-30 NOTE — Transfer of Care (Signed)
Immediate Anesthesia Transfer of Care Note  Patient: Jacqueline Riggs  Procedure(s) Performed: TOTAL HIP ARTHROPLASTY ANTERIOR APPROACH (Left )  Patient Location: PACU  Anesthesia Type:Spinal  Level of Consciousness: drowsy and patient cooperative  Airway & Oxygen Therapy: Patient Spontanous Breathing and Patient connected to face mask oxygen  Post-op Assessment: Report given to RN and Post -op Vital signs reviewed and stable  Post vital signs: Reviewed and stable  Last Vitals:  Vitals Value Taken Time  BP 97/50 06/30/2018 12:08 PM  Temp    Pulse 44 06/30/2018 12:12 PM  Resp 11 06/30/2018 12:12 PM  SpO2 98 % 06/30/2018 12:12 PM  Vitals shown include unvalidated device data.  Last Pain:  Vitals:   06/30/18 0832  TempSrc:   PainSc: 0-No pain         Complications: No apparent anesthesia complications

## 2018-06-30 NOTE — H&P (Signed)
TOTAL HIP ADMISSION H&P  Patient is admitted for left total hip arthroplasty.  Subjective:  Chief Complaint: left hip pain  HPI: Jacqueline Riggs, 63 y.o. female, has a history of pain and functional disability in the left hip(s) due to arthritis and patient has failed non-surgical conservative treatments for greater than 12 weeks to include NSAID's and/or analgesics, weight reduction as appropriate and activity modification.  Onset of symptoms was gradual starting 3 years ago with gradually worsening course since that time.The patient noted no past surgery on the left hip(s).  Patient currently rates pain in the left hip at 8 out of 10 with activity. Patient has night pain, worsening of pain with activity and weight bearing, trendelenberg gait, pain that interfers with activities of daily living, pain with passive range of motion and joint swelling. Patient has evidence of subchondral sclerosis and joint space narrowing by imaging studies. This condition presents safety issues increasing the risk of falls. This patient has had failure of all reasonable conservative care..  There is no current active infection.  There are no active problems to display for this patient.  Past Medical History:  Diagnosis Date  . Arthritis   . Chronic kidney disease    Stage 3  . Depression   . Hypertension   . Sleep apnea     Past Surgical History:  Procedure Laterality Date  . DG THUMB LEFT HAND Left    replacement  . JOINT REPLACEMENT Left 05/24/1998  . JOINT REPLACEMENT Right 02/23/2005  . KNEE ARTHROSCOPY Left 10/07/1992   08/03/07  . KNEE ARTHROSCOPY Right 12/18/2002  . Left finger trigger release Left 09/22/2005  . LIPOMA EXCISION Right    Flank  . MOUTH SURGERY  06/25/2002  . TOTAL SHOULDER ARTHROPLASTY Right 01/04/2006  . WISDOM TOOTH EXTRACTION  01/1971    Current Facility-Administered Medications  Medication Dose Route Frequency Provider Last Rate Last Dose  . bupivacaine liposome (EXPAREL)  1.3 % injection 133 mg  10 mL Infiltration Once Jodi Geralds, MD      . ceFAZolin (ANCEF) IVPB 2g/100 mL premix  2 g Intravenous On Call to OR Jodi Geralds, MD      . chlorhexidine (HIBICLENS) 4 % liquid 4 application  60 mL Topical Once Jodi Geralds, MD      . lactated ringers infusion   Intravenous Continuous Trevor Iha, MD 50 mL/hr at 06/30/18 [phone]    . tranexamic acid (CYKLOKAPRON) IVPB 1,000 mg  1,000 mg Intravenous To OR Jodi Geralds, MD       Allergies  Allergen Reactions  . Penicillins Rash    Did it involve swelling of the face/tongue/throat, SOB, or low BP? Yes Did it involve sudden or severe rash/hives, skin peeling, or any reaction on the inside of your mouth or nose? No Did you need to seek medical attention at a hospital or doctor's office? Yes When did it last happen?Late 1980s If all above answers are "NO", may proceed with cephalosporin use.   Marland Kitchen Dexilant [Dexlansoprazole] Other (See Comments)    Stomach pain and cramps  . Prilosec [Omeprazole] Other (See Comments)    Stomach pain and cramps  . Shellfish Allergy Diarrhea    Oysters and mussles  . Ceclor [Cefaclor] Rash  . Hydrochlorothiazide Rash  . Latex Rash    Contact dermatitis  . Relafen [Nabumetone] Rash    Also, no NSAIDs due to kidney function  . Sulfa Antibiotics Rash    Social History   Tobacco Use  .  Smoking status: Former Smoker    Packs/day: 2.00    Years: 15.00    Pack years: 30.00    Last attempt to quit: 1995    Years since quitting: 25.3  . Smokeless tobacco: Never Used  Substance Use Topics  . Alcohol use: Yes    Alcohol/week: 1.0 standard drinks    Types: 1 Cans of beer per week    Comment: socially    History reviewed. No pertinent family history.   ROS ROS: I have reviewed the patient's review of systems thoroughly and there are no positive responses as relates to the HPI. Objective:  Physical Exam  Vital signs in last 24 hours: Temp:  [98.2 F (36.8 C)] 98.2 F (36.8  C) (05/15 0814) Pulse Rate:  [51] 51 (05/15 0814) Resp:  [16] 16 (05/15 0814) BP: (129)/(76) 129/76 (05/15 0814) SpO2:  [98 %] 98 % (05/15 0814) Weight:  [92.8 kg] 92.8 kg (05/15 0832) Well-developed well-nourished patient in no acute distress. Alert and oriented x3 HEENT:within normal limits Cardiac: Regular rate and rhythm Pulmonary: Lungs clear to auscultation Abdomen: Soft and nontender.  Normal active bowel sounds  Musculoskeletal: (l hip: painful rom limited rom limited IR Labs: Recent Results (from the past 2160 hour(s))  APTT     Status: None   Collection Time: 06/26/18  2:09 PM  Result Value Ref Range   aPTT 33 24 - 36 seconds    Comment: Performed at [facility], [address] [place], [place] [address]  CBC WITH DIFFERENTIAL     Status: None   Collection Time: 06/26/18  2:09 PM  Result Value Ref Range   WBC 7.0 4.0 - 10.5 K/uL   RBC 4.84 3.87 - 5.11 MIL/uL   Hemoglobin 14.6 12.0 - 15.0 g/dL   HCT 40.9 81.1 - 91.4 %   MCV 95.0 80.0 - 100.0 fL   MCH 30.2 26.0 - 34.0 pg   MCHC 31.7 30.0 - 36.0 g/dL   RDW 78.2 95.6 - 21.3 %   Platelets 221 150 - 400 K/uL   nRBC 0.0 0.0 - 0.2 %   Neutrophils Relative % 68 %   Neutro Abs 4.8 1.7 - 7.7 K/uL   Lymphocytes Relative 22 %   Lymphs Abs 1.5 0.7 - 4.0 K/uL   Monocytes Relative 8 %   Monocytes Absolute 0.5 0.1 - 1.0 K/uL   Eosinophils Relative 1 %   Eosinophils Absolute 0.1 0.0 - 0.5 K/uL   Basophils Relative 1 %   Basophils Absolute 0.1 0.0 - 0.1 K/uL   Immature Granulocytes 0 %   Abs Immature Granulocytes 0.02 0.00 - 0.07 K/uL    Comment: Performed at [facility], [address] [place], [place] [address]  Comprehensive metabolic panel     Status: Abnormal   Collection Time: 06/26/18  2:09 PM  Result Value Ref Range   Sodium 140 135 - 145 mmol/L   Potassium 4.0 3.5 - 5.1 mmol/L   Chloride 101 98 - 111 mmol/L   CO2 28 22 - 32 mmol/L   Glucose, Bld 89 70 - 99 mg/dL   BUN  17 8 - 23 mg/dL   Creatinine, Ser 8.46 (H) 0.44 - 1.00 mg/dL   Calcium 9.4 8.9 - 96.2 mg/dL   Total Protein 7.6 6.5 - 8.1 g/dL   Albumin 4.5 3.5 - 5.0 g/dL   AST 23 15 - 41 U/L   ALT 17 0 - 44 U/L   Alkaline Phosphatase  58 38 - 126 U/L   Total Bilirubin 0.8 0.3 - 1.2 mg/dL   GFR calc non Af Amer 39 (L) >60 mL/min   GFR calc Af Amer 45 (L) >60 mL/min   Anion gap 11 5 - 15    Comment: Performed at [facility], [address] [place], [place] [address]  Protime-INR     Status: None   Collection Time: 06/26/18  2:09 PM  Result Value Ref Range   Prothrombin Time 13.6 11.4 - 15.2 seconds   INR 1.1 0.8 - 1.2    Comment: (NOTE) INR goal varies based on device and disease states. Performed at [facility], [address] [place], [place] [address]   Type and screen Order type and screen if day of surgery is less than 15 days from draw of preadmission visit or order morning of surgery if day of surgery is greater than 6 days from preadmission visit.     Status: None   Collection Time: 06/26/18  2:09 PM  Result Value Ref Range   ABO/RH(D) O POS    Antibody Screen NEG    Sample Expiration 07/03/2018,2359    Extend sample reason      NO TRANSFUSIONS OR PREGNANCY IN THE PAST 3 MONTHS Performed at [facility], [address] [place], [place] [address]   Urinalysis, Routine w reflex microscopic     Status: Abnormal   Collection Time: 06/26/18  2:09 PM  Result Value Ref Range   Color, Urine AMBER (A) YELLOW    Comment: BIOCHEMICALS MAY BE AFFECTED BY COLOR   APPearance HAZY (A) CLEAR   Specific Gravity, Urine 1.027 1.005 - 1.030   pH 5.0 5.0 - 8.0   Glucose, UA NEGATIVE NEGATIVE mg/dL   Hgb urine dipstick NEGATIVE NEGATIVE   Bilirubin Urine SMALL (A) NEGATIVE   Ketones, ur 5 (A) NEGATIVE mg/dL   Protein, ur NEGATIVE NEGATIVE mg/dL   Nitrite NEGATIVE NEGATIVE   Leukocytes,Ua NEGATIVE NEGATIVE    Comment: Performed at [facility], [address] [place], [place] [address]  Surgical pcr screen     Status: Abnormal   Collection Time: 06/26/18  2:10 PM  Result Value Ref Range   MRSA, PCR NEGATIVE NEGATIVE   Staphylococcus aureus POSITIVE (A) NEGATIVE    Comment: (NOTE) The Xpert SA Assay (FDA approved for NASAL specimens in patients 65 years of age and older), is one component of a comprehensive surveillance program. It is not intended to diagnose infection nor to guide or monitor treatment. Performed at [facility], [address] [place], [place] [address]   Novel Coronavirus, NAA (hospital order; send-out to ref lab)     Status: None   Collection Time: 06/26/18  2:51 PM  Result Value Ref Range   SARS-CoV-2, NAA NOT DETECTED NOT DETECTED    Comment: (NOTE) This test was developed and its performance characteristics determined by [facility]. This test has not been FDA cleared or approved. This test has been authorized by FDA under an Emergency Use Authorization (EUA). This test is only authorized for the duration of time the declaration that circumstances exist justifying the authorization of the emergency use of in vitro diagnostic tests for detection of SARS-CoV-2 virus and/or diagnosis of COVID-19 infection under section 564(b)(1) of the Act, 21 U.S.C. 130QMV-7(Q)(4360bbb-3(b)(1), unless the authorization is terminated or revoked sooner. When diagnostic testing is negative, the possibility of a false negative result should be considered  in the context of a patient's recent exposures and the presence of clinical signs and symptoms consistent with COVID-19. An individual without symptoms of COVID-19 and who is not shedding SARS-CoV-2 virus would expect to have a negative (not detected) result in this assay. Performed  At: [facility] [address] [place], [place] [address] Jolene Schimke MD QV:[id number]    Coronavirus Source NASOPHARYNGEAL      Comment: Performed at [facility], [address], [place], [place] [address]    Estimated body mass index is 37.4 kg/m as calculated from the following:   Height as of this encounter: 5\' 2"  (1.575 m).   Weight as of this encounter: 92.8 kg.   Imaging Review Plain radiographs demonstrate moderate degenerative joint disease of the left hip(s). The bone quality appears to be fair for age and reported activity level.  MRI-+labral taer and moderate arthritis     Assessment/Plan:  End stage arthritis, left hip(s)  The patient history, physical examination, clinical judgement of the provider and imaging studies are consistent with end stage degenerative joint disease of the left hip(s) and total hip arthroplasty is deemed medically necessary. The treatment options including medical management, injection therapy, arthroscopy and arthroplasty were discussed at length. The risks and benefits of total hip arthroplasty were presented and reviewed. The risks due to aseptic loosening, infection, stiffness, dislocation/subluxation,  thromboembolic complications and other imponderables were discussed.  The patient acknowledged the explanation, agreed to proceed with the plan and consent was signed. Patient is being admitted for inpatient treatment for surgery, pain control, PT, OT, prophylactic antibiotics, VTE prophylaxis, progressive ambulation and ADL's and discharge planning.The patient is planning to be discharged home with home health services

## 2018-06-30 NOTE — Evaluation (Addendum)
Physical Therapy Evaluation Patient Details Name: Jacqueline Riggs MRN: [id number] DOB: [birth year] Today's Date: 06/30/2018   History of Present Illness  63 yo female s/p L DA-THA on 06/30/18. PMH includes OA, CKD III, depression, HTN, R shoulder arthroplasty.   Clinical Impression  Pt presents with L hip pain, decreased L hip strength secondary to surgery, difficulty performing mobility tasks, and decreased activity tolerance. Pt to benefit from acute PT to address deficits. Pt ambulated short room distance with very increased time, verbal cuing for form and safety provided throughout. Pt educated on ankle pumps (20/hour) to perform this afternoon/evening to increase circulation, to pt's tolerance and limited by pain. PT to progress mobility as tolerated, and will continue to follow acutely.        Follow Up Recommendations Follow surgeon's recommendation for DC plan and follow-up therapies;Supervision for mobility/OOB(HHPT)    Equipment Recommendations  3in1 (PT);Rolling walker with 5" wheels    Recommendations for Other Services       Precautions / Restrictions Precautions Precautions: Fall Restrictions Weight Bearing Restrictions: No Other Position/Activity Restrictions: WBAT       Mobility  Bed Mobility Overal bed mobility: Needs Assistance Bed Mobility: Supine to Sit     Supine to sit: Min assist;HOB elevated     General bed mobility comments: Min assist for LLE management, scooting to EOB with use of bed pad. Pt slightly "fuzzy" upon coming to EOB, improved with time and suspect due to medication.   Transfers Overall transfer level: Needs assistance Equipment used: Rolling walker (2 wheeled) Transfers: Sit to/from Stand Sit to Stand: Min assist;From elevated surface         General transfer comment: Min assist for power up, steadying. Verbal cuing for hand placement when rising.   Ambulation/Gait Ambulation/Gait assistance: Min guard Gait Distance (Feet): 10  Feet Assistive device: Rolling walker (2 wheeled) Gait Pattern/deviations: Step-to pattern;Decreased step length - right;Decreased step length - left;Trunk flexed Gait velocity: very decr    General Gait Details: Min guard for safety, verbal cuing for placement in RW, sequencing, turning RW.   Stairs            Wheelchair Mobility    Modified Rankin (Stroke Patients Only)       Balance Overall balance assessment: Mild deficits observed, not formally tested                                           Pertinent Vitals/Pain Pain Assessment: 0-10 Pain Score: 5  Pain Location: L hip  Pain Descriptors / Indicators: Burning;Aching Pain Intervention(s): Limited activity within patient's tolerance;Monitored during session;Repositioned;Premedicated before session    Home Living Family/patient expects to be discharged to:: Private residence Living Arrangements: Spouse/significant other Available Help at Discharge: Family;Available 24 hours/day(pt taking time off to assist) Type of Home: House Home Access: Stairs to enter Entrance Stairs-Rails: Right Entrance Stairs-Number of Steps: 3 Home Layout: Two level Home Equipment: Cane - single point      Prior Function Level of Independence: Independent with assistive device(s)         Comments: Used cane for ambulation PTA     Hand Dominance   Dominant Hand: Right    Extremity/Trunk Assessment   Upper Extremity Assessment Upper Extremity Assessment: Overall WFL for tasks assessed    Lower Extremity Assessment Lower Extremity Assessment: Overall WFL for tasks assessed;LLE deficits/detail LLE Deficits / Details:  suspected post-surgical hip weakness; able to perform ankle pumps, quad set, heel slide, SLR with lift assist during bed mobility  LLE Sensation: WNL    Cervical / Trunk Assessment Cervical / Trunk Assessment: Normal  Communication   Communication: No difficulties  Cognition  Arousal/Alertness: Awake/alert Behavior During Therapy: WFL for tasks assessed/performed Overall Cognitive Status: Within Functional Limits for tasks assessed                                        General Comments      Exercises     Assessment/Plan    PT Assessment Patient needs continued PT services  PT Problem List Decreased strength;Decreased mobility;Decreased safety awareness;Decreased range of motion;Decreased balance;Decreased knowledge of use of DME;Pain;Decreased activity tolerance       PT Treatment Interventions DME instruction;Functional mobility training;Balance training;Patient/family education;Gait training;Therapeutic activities;Stair training;Therapeutic exercise    PT Goals (Current goals can be found in the Care Plan section)  Acute Rehab PT Goals Patient Stated Goal: go home PT Goal Formulation: With patient Time For Goal Achievement: 07/07/18 Potential to Achieve Goals: Good    Frequency 7X/week   Barriers to discharge        Co-evaluation               AM-PAC PT "6 Clicks" Mobility  Outcome Measure Help needed turning from your back to your side while in a flat bed without using bedrails?: A Little Help needed moving from lying on your back to sitting on the side of a flat bed without using bedrails?: A Little Help needed moving to and from a bed to a chair (including a wheelchair)?: A Little Help needed standing up from a chair using your arms (e.g., wheelchair or bedside chair)?: A Little Help needed to walk in hospital room?: A Little Help needed climbing 3-5 steps with a railing? : A Lot 6 Click Score: 17    End of Session Equipment Utilized During Treatment: Gait belt;Oxygen(O2 reapplied after session) Activity Tolerance: Patient limited by fatigue;Patient limited by pain Patient left: in chair;with chair alarm set;with call bell/phone within reach;with SCD's reapplied Nurse Communication: Mobility status PT Visit  Diagnosis: Other abnormalities of gait and mobility (R26.89);Difficulty in walking, not elsewhere classified (R26.2)    Time: [id number] PT Time Calculation (min) (ACUTE ONLY): 29 min   Charges:   PT Evaluation $PT Eval Low Complexity: 1 Low PT Treatments $Gait Training: 8-22 mins        Nicola PoliceAlexa D Abbagayle Zaragoza, PT Acute Rehabilitation Services Pager [phone]  Office [phone]   Agustin Swatek D Despina Hiddenure 06/30/2018, 6:03 PM

## 2018-06-30 NOTE — Op Note (Signed)
NAME: Jacqueline Riggs, Jacqueline B. MEDICAL RECORD [id number] ACCOUNT [id number] DATE OF BIRTH:Mar 11, 1955 FACILITY: [facility]: [facility] PHYSICIAN:Artrice Kraker L. Jasiya Markie, MD  OPERATIVE REPORT  DATE OF PROCEDURE:  06/30/2018  PREOPERATIVE DIAGNOSIS:  Painful left hip with labral pathology and severe activity related pain having failed conservative care including injection therapy and activity modification.  POSTOPERATIVE DIAGNOSIS:  Painful left hip with labral pathology and severe activity related pain having failed conservative care including injection therapy and activity modification.  PROCEDURE:1.  Left total hip replacement with a Corail stem size 9, 50 mm Pinnacle Gription cup, 32 mm +1.5 delta ceramic hip ball and a 32 mm neutral liner. 2. Interpretation of multiple fluoroscopic images  SURGEON:  Jodi GeraldsJohn Cranston Koors, MD  ASSISTANT:  Gus PumaJim Bethune PA-C, was present for the entire case and assisted by manipulation of the leg, pulling traction and closing to minimize OR time.  BRIEF HISTORY:  The patient is a 63 year old female who has had severe pain in the left hip for many years.  I treated her conservatively for a period of time.  MRI was obtained, which showed that she had an anterior superior labral tear.  I sent her to  Dr. Duwayne HeckJason Rogers in town who evaluated her and felt she was not a good candidate for hip arthroscopy and felt that total hip replacement was appropriate.  We had a long talk with her about that given that her x-rays did not show severe bone-on-bone  change, but she did have severe hip pain that had not been treated with intraarticular injection, activity modification and time and with failure of all conservative care, she is taken to the operating room for left total hip replacement.  DESCRIPTION OF PROCEDURE:  The patient brought to the operating room after adequate anesthesia was obtained with a spinal anesthetic placed supine on the operating table.  She was then moved onto the HymeraHana bed.   All bony prominences well padded.   Attention turned to the left hip where after routine prep and drape an incision was made for an anterior approach to the hip, subcutaneous tissue down to the tensor fascia, which was identified and divided in line with its fibers.  The muscle was finger  dissected and retractors put in place above and below the neck.  At this point, the capsule was opened and tagged.  Attention was then turned towards the neck where a provisional neck cut was made.  At this point, retractors were put in place above and  below the acetabulum.  Acetabulum was sequentially reamed to a level of 49 mm and a 50 mm Pinnacle Gription cup was placed.  There was 45 degrees of lateral opening and 30 degrees of anteversion.  Excellent rim fit was obtained and this cup was hammered  into place.  Once this was done, a neutral liner was placed.  Attention was then turned towards the stem side where the hip was externally rotated adducted and extended.  The canal was opened.  I used a cookie cutter followed by a size 8 introducer,  followed by a 9.  The 9 was the right fit.  We trialed it.  It was a touch long.  We took the 9.  We took down a little farther and calcar planed it.  Retrialed with a +1.5 perfect leg length.  At this point, the final trial was removed and the final  implant was opened and placed.  A +1.5 ceramic hip ball placed.  A reduction was undertaken.  Final images  were taken showing a perfect leg length and symmetry.  Excellent size fit and fill.  At this point, the capsule was closed with 1 Vicryl running.   The tensor fascia was closed with 0 Vicryl and the skin with 2-0 Vicryl and 3-0 Monocryl subcuticular.  Benzoin, Steri-Strips, dry sterile compressive dressing was applied.  The patient was noted to be in satisfactory condition.  Estimated blood loss for  procedure was 200 mL.  TN/NUANCE  D:06/30/2018 T:06/30/2018 JOB:[id number]

## 2018-06-30 NOTE — Plan of Care (Signed)
  Problem: Education: Goal: Knowledge of the prescribed therapeutic regimen will improve Outcome: Progressing Goal: Understanding of discharge needs will improve Outcome: Progressing Goal: Individualized Educational Video(s) Outcome: Progressing   Problem: Activity: Goal: Ability to avoid complications of mobility impairment will improve Outcome: Progressing   

## 2018-06-30 NOTE — Plan of Care (Signed)
  Problem: Education: Goal: Knowledge of the prescribed therapeutic regimen will improve Outcome: Progressing Goal: Understanding of discharge needs will improve Outcome: Progressing Goal: Individualized Educational Video(s) Outcome: Progressing   Problem: Activity: Goal: Ability to avoid complications of mobility impairment will improve Outcome: Progressing Goal: Ability to tolerate increased activity will improve Outcome: Progressing   Problem: Clinical Measurements: Goal: Postoperative complications will be avoided or minimized Outcome: Progressing   Problem: Pain Management: Goal: Pain level will decrease with appropriate interventions Outcome: Progressing   Problem: Skin Integrity: Goal: Will show signs of wound healing Outcome: Progressing   Problem: Education: Goal: Knowledge of General Education information will improve Description Including pain rating scale, medication(s)/side effects and non-pharmacologic comfort measures Outcome: Progressing   Problem: Health Behavior/Discharge Planning: Goal: Ability to manage health-related needs will improve Outcome: Progressing   Problem: Clinical Measurements: Goal: Ability to maintain clinical measurements within normal limits will improve Outcome: Progressing Goal: Will remain free from infection Outcome: Progressing Goal: Diagnostic test results will improve Outcome: Progressing Goal: Respiratory complications will improve Outcome: Progressing Goal: Cardiovascular complication will be avoided Outcome: Progressing   Problem: Activity: Goal: Risk for activity intolerance will decrease Outcome: Progressing   Problem: Nutrition: Goal: Adequate nutrition will be maintained Outcome: Progressing   Problem: Coping: Goal: Level of anxiety will decrease Outcome: Progressing   Problem: Elimination: Goal: Will not experience complications related to bowel motility Outcome: Progressing Goal: Will not experience  complications related to urinary retention Outcome: Progressing   Problem: Pain Managment: Goal: General experience of comfort will improve Outcome: Progressing   Problem: Safety: Goal: Ability to remain free from injury will improve Outcome: Progressing   Problem: Skin Integrity: Goal: Risk for impaired skin integrity will decrease Outcome: Progressing  Pt is progressing as expected on day of surgery

## 2018-06-30 NOTE — Anesthesia Postprocedure Evaluation (Signed)
Anesthesia Post Note  Patient: Jacqueline Riggs  Procedure(s) Performed: TOTAL HIP ARTHROPLASTY ANTERIOR APPROACH (Left )     Patient location during evaluation: PACU Anesthesia Type: Spinal Level of consciousness: awake and alert Pain management: pain level controlled Vital Signs Assessment: post-procedure vital signs reviewed and stable Respiratory status: spontaneous breathing and respiratory function stable Cardiovascular status: blood pressure returned to baseline and stable Postop Assessment: spinal receding and no apparent nausea or vomiting Anesthetic complications: no    Last Vitals:  Vitals:   06/30/18 1245 06/30/18 1303  BP: 111/62 124/71  Pulse: (!) 50 (!) 51  Resp: 11 14  Temp: (!) 36.3 C (!) 36.4 C  SpO2: 99% 98%    Last Pain:  Vitals:   06/30/18 1303  TempSrc: Oral  PainSc:                  Beryle Lathe

## 2018-06-30 NOTE — Discharge Instructions (Signed)

## 2018-07-01 DIAGNOSIS — Z882 Allergy status to sulfonamides status: Secondary | ICD-10-CM | POA: Diagnosis not present

## 2018-07-01 DIAGNOSIS — Z881 Allergy status to other antibiotic agents status: Secondary | ICD-10-CM | POA: Diagnosis not present

## 2018-07-01 DIAGNOSIS — N183 Chronic kidney disease, stage 3 (moderate): Secondary | ICD-10-CM | POA: Diagnosis not present

## 2018-07-01 DIAGNOSIS — Z87891 Personal history of nicotine dependence: Secondary | ICD-10-CM | POA: Diagnosis not present

## 2018-07-01 DIAGNOSIS — I129 Hypertensive chronic kidney disease with stage 1 through stage 4 chronic kidney disease, or unspecified chronic kidney disease: Secondary | ICD-10-CM | POA: Diagnosis not present

## 2018-07-01 DIAGNOSIS — F329 Major depressive disorder, single episode, unspecified: Secondary | ICD-10-CM | POA: Diagnosis not present

## 2018-07-01 DIAGNOSIS — Z888 Allergy status to other drugs, medicaments and biological substances status: Secondary | ICD-10-CM | POA: Diagnosis not present

## 2018-07-01 DIAGNOSIS — X58XXXA Exposure to other specified factors, initial encounter: Secondary | ICD-10-CM | POA: Diagnosis not present

## 2018-07-01 DIAGNOSIS — Z96611 Presence of right artificial shoulder joint: Secondary | ICD-10-CM | POA: Diagnosis not present

## 2018-07-01 DIAGNOSIS — M1612 Unilateral primary osteoarthritis, left hip: Secondary | ICD-10-CM | POA: Diagnosis not present

## 2018-07-01 DIAGNOSIS — M25552 Pain in left hip: Secondary | ICD-10-CM | POA: Diagnosis not present

## 2018-07-01 DIAGNOSIS — G4730 Sleep apnea, unspecified: Secondary | ICD-10-CM | POA: Diagnosis not present

## 2018-07-01 DIAGNOSIS — Z880 Allergy status to penicillin: Secondary | ICD-10-CM | POA: Diagnosis not present

## 2018-07-01 DIAGNOSIS — S43432A Superior glenoid labrum lesion of left shoulder, initial encounter: Secondary | ICD-10-CM | POA: Diagnosis not present

## 2018-07-01 LAB — CBC
HCT: 46.5 % — ABNORMAL HIGH (ref 36.0–46.0)
Hemoglobin: 14.4 g/dL (ref 12.0–15.0)
MCH: 29.3 pg (ref 26.0–34.0)
MCHC: 31 g/dL (ref 30.0–36.0)
MCV: 94.5 fL (ref 80.0–100.0)
Platelets: 168 10*3/uL (ref 150–400)
RBC: 4.92 MIL/uL (ref 3.87–5.11)
RDW: 13.2 % (ref 11.5–15.5)
WBC: 12 10*3/uL — ABNORMAL HIGH (ref 4.0–10.5)
nRBC: 0 % (ref 0.0–0.2)

## 2018-07-01 NOTE — TOC Initial Note (Signed)
Transition of Care [facility]) - Initial/Assessment Note    Patient Details  Name: Jacqueline Riggs MRN: [id number] Date of Birth: 10/23/1955  [facility] CM/SW Contact:    Elliot Cousin, RN Phone Number: 07/01/2018, 11:06 AM  Clinical Narrative:                 Spoke to pt and offered choice for [facility]. Pt agreeable to [facility] for HH. Pt requested RW and 3n1 bedside commode. Contacted [employer] [facility] for DME. Husband will be at home to assist with care.    Expected Discharge Plan: Home w Home Health Services Barriers to Discharge: No Barriers Identified   Patient Goals and CMS Choice Patient states their goals for this hospitalization and ongoing recovery are:: goal to have surgery and feel better CMS Medicare.gov Compare Post Acute Care list provided to:: Patient Choice offered to / list presented to : Patient  Expected Discharge Plan and Services Expected Discharge Plan: Home w Home Health Services In-house Referral: NA Discharge Planning Services: CM Consult Post Acute Care Choice: Home Health Living arrangements for the past 2 months: Single Family Home Expected Discharge Date: 07/01/18               DME Arranged: 3-N-1 DME Agency: [facility] Date DME Agency Contacted: 07/01/18 Time DME Agency Contacted: 1105 Representative spoke with at DME Agency: Skipper Cliche HH Arranged: PT HH Agency: [facility] Date HH Agency Contacted: 07/01/18 Time HH Agency Contacted: 1105 Representative spoke with at [facility] Agency: Benjaman Lobe  Prior Living Arrangements/Services Living arrangements for the past 2 months: Single Family Home Lives with:: Spouse Patient language and need for interpreter reviewed:: Yes Do you feel safe going back to the place where you live?: Yes      Need for Family Participation in Patient Care: Yes (Comment) Care giver support system in place?: Yes (comment)   Criminal Activity/Legal Involvement Pertinent to Current  Situation/Hospitalization: No - Comment as needed  Activities of Daily Living Home Assistive Devices/Equipment: Cane (specify quad or straight) ADL Screening (condition at time of admission) Patient's cognitive ability adequate to safely complete daily activities?: Yes Is the patient deaf or have difficulty hearing?: No Does the patient have difficulty seeing, even when wearing glasses/contacts?: No Does the patient have difficulty concentrating, remembering, or making decisions?: No Patient able to express need for assistance with ADLs?: Yes Does the patient have difficulty dressing or bathing?: No Independently performs ADLs?: Yes (appropriate for developmental age) Does the patient have difficulty walking or climbing stairs?: Yes Weakness of Legs: Left Weakness of Arms/Hands: None  Permission Sought/Granted Permission sought to share information with : Case Manager, Family Supports, PCP Permission granted to share information with : Yes, Verbal Permission Granted  Share Information with NAME: Dayanis Yuille  Permission granted to share info w AGENCY: [facility], [facility]  Permission granted to share info w Relationship: husband  Permission granted to share info w Contact Information: [phone]  Emotional Assessment   Attitude/Demeanor/Rapport: Engaged Affect (typically observed): Accepting Orientation: : Oriented to Self, Oriented to Place, Oriented to  Time, Oriented to Situation   Psych Involvement: No (comment)  Admission diagnosis:  LEFT HIP DEGENERATIVE JOINT DISEASE WITH LATERAL TEAR Patient Active Problem List   Diagnosis Date Noted  . Primary osteoarthritis of left hip 06/30/2018   PCP:  Henrine Screws, MD Pharmacy:   [facility] [address] [place] - [address] [place] [address]  Phone: [phone] Fax: [phone]  [id number] [place], [place] - [address] AT [facility] [address] &  [facility] [address] [place] [address] Phone: [phone] Fax: [phone]  [facility] [place], [place] - [address] AT Portal to [facility] [address] [place] [address] Phone: [phone] Fax: [phone]     Social Determinants of Health (SDOH) Interventions    Readmission Risk Interventions No flowsheet data found.

## 2018-07-01 NOTE — Progress Notes (Signed)
   07/01/18 1416  PT Visit Information  Last PT Received On 07/01/18  Pt progressing well, exercise focused session, able to return demo HEP. Pt ready to d/c from PT standpoint  Assistance Needed +1  History of Present Illness 63 yo female s/p L DA-THA on 06/30/18. PMH includes OA, CKD III, depression, HTN, R shoulder arthroplasty.   Subjective Data  Patient Stated Goal go home  Precautions  Precautions Knee  Restrictions  Other Position/Activity Restrictions WBAT   Pain Assessment  Pain Assessment 0-10  Pain Score 4  Pain Location L hip   Pain Descriptors / Indicators Burning;Aching  Pain Intervention(s) Limited activity within patient's tolerance;Monitored during session;Repositioned  Cognition  Arousal/Alertness Awake/alert  Behavior During Therapy WFL for tasks assessed/performed  Overall Cognitive Status Within Functional Limits for tasks assessed  Bed Mobility  Bed Mobility Supine to Sit;Sit to Supine  Supine to sit HOB elevated;Supervision  Sit to supine Supervision;HOB elevated  General bed mobility comments for safety  Total Joint Exercises  Ankle Circles/Pumps AROM;Both;10 reps  Quad Sets AROM;Both;10 reps  Short Arc Quad AROM;Left;10 reps  Heel Slides AAROM;Left;10 reps  Hip ABduction/ADduction AROM;Left;10 reps;AAROM  Long Arc Quad AROM;Left;10 reps  PT - End of Session  Activity Tolerance Patient tolerated treatment well  Patient left in bed;with call bell/phone within reach  Nurse Communication Mobility status   PT - Assessment/Plan  PT Plan Current plan remains appropriate  PT Visit Diagnosis Other abnormalities of gait and mobility (R26.89);Difficulty in walking, not elsewhere classified (R26.2)  PT Frequency (ACUTE ONLY) 7X/week  Follow Up Recommendations Follow surgeon's recommendation for DC plan and follow-up therapies;Supervision for mobility/OOB  PT equipment 3in1 (PT);Rolling walker with 5" wheels  AM-PAC PT "6 Clicks" Mobility Outcome Measure  (Version 2)  Help needed turning from your back to your side while in a flat bed without using bedrails? 3  Help needed moving from lying on your back to sitting on the side of a flat bed without using bedrails? 3  Help needed moving to and from a bed to a chair (including a wheelchair)? 3  Help needed standing up from a chair using your arms (e.g., wheelchair or bedside chair)? 3  Help needed to walk in hospital room? 3  Help needed climbing 3-5 steps with a railing?  3  6 Click Score 18  Consider Recommendation of Discharge To: Home with [facility]  PT Goal Progression  Progress towards PT goals Progressing toward goals  Acute Rehab PT Goals  PT Goal Formulation With patient  Time For Goal Achievement 07/07/18  Potential to Achieve Goals Good  PT Time Calculation  PT Start Time (ACUTE ONLY) 1310  PT Stop Time (ACUTE ONLY) 1328  PT Time Calculation (min) (ACUTE ONLY) 18 min  PT General Charges  $$ ACUTE PT VISIT 1 Visit  PT Treatments  $Therapeutic Exercise 8-22 mins

## 2018-07-01 NOTE — Plan of Care (Signed)
Pt to d/c home with family in stable condition. No s/s of distress at time of discharge. Pt had all necessary dme at discharge.

## 2018-07-01 NOTE — Plan of Care (Signed)
  Problem: Education: Goal: Knowledge of General Education information will improve Description Including pain rating scale, medication(s)/side effects and non-pharmacologic comfort measures Outcome: Progressing   Problem: Pain Management: Goal: Pain level will decrease with appropriate interventions Outcome: Progressing   Problem: Clinical Measurements: Goal: Will remain free from infection Outcome: Progressing   Problem: Clinical Measurements: Goal: Diagnostic test results will improve Outcome: Progressing   Problem: Clinical Measurements: Goal: Respiratory complications will improve Outcome: Progressing   Problem: Clinical Measurements: Goal: Cardiovascular complication will be avoided Outcome: Progressing

## 2018-07-01 NOTE — Discharge Summary (Addendum)
Physician Discharge Summary  Patient ID: Jacqueline Riggs MRN: [id number] DOB/AGE: March 21, 1955 63 y.o.  Admit date: 06/30/2018 Discharge date: 07/01/2018  Admission Diagnoses:  Primary osteoarthritis of left hip  Discharge Diagnoses:  Principal Problem:   Primary osteoarthritis of left hip   Past Medical History:  Diagnosis Date  . Arthritis   . Chronic kidney disease    Stage 3  . Depression   . Hypertension   . Sleep apnea     Surgeries: Procedure(s): TOTAL HIP ARTHROPLASTY ANTERIOR APPROACH on 06/30/2018   Consultants (if any):   Discharged Condition: Improved  Hospital Course: Jacqueline Riggs is an 63 y.o. female who was admitted 06/30/2018 with a diagnosis of Primary osteoarthritis of left hip and went to the operating room on 06/30/2018 and underwent the above named procedures.    She was given perioperative antibiotics:  Anti-infectives (From admission, onward)   Start     Dose/Rate Route Frequency Ordered Stop   06/30/18 1600  ceFAZolin (ANCEF) IVPB 2g/100 mL premix     2 g 200 mL/hr over 30 Minutes Intravenous Every 6 hours 06/30/18 1308 06/30/18 2241   06/30/18 0900  ceFAZolin (ANCEF) IVPB 2g/100 mL premix     2 g 200 mL/hr over 30 Minutes Intravenous On call to O.R. 06/30/18 1007 06/30/18 0950    .  She was given sequential compression devices, early ambulation, and ASA for DVT prophylaxis.  She benefited maximally from the hospital stay and there were no complications.  She was afeb, tol PO, and cleared PT prior to d/c from hospital, with clean dressing, intact LT sens P/D/1st web, intact F/E ankle and toes.  Recent vital signs:  Vitals:   07/01/18 0238 07/01/18 0553  BP: 101/81 123/79  Pulse: 70 71  Resp: 16 16  Temp: 97.6 F (36.4 C) 97.7 F (36.5 C)  SpO2: 91% 93%    Recent laboratory studies:  Lab Results  Component Value Date   HGB 14.4 07/01/2018   HGB 14.6 06/26/2018   HGB 12.2 11/23/2007   Lab Results  Component Value Date   WBC 12.0 (H)  07/01/2018   PLT 168 07/01/2018   Lab Results  Component Value Date   INR 1.1 06/26/2018   Lab Results  Component Value Date   NA 140 06/26/2018   K 4.0 06/26/2018   CL 101 06/26/2018   CO2 28 06/26/2018   BUN 17 06/26/2018   CREATININE 1.42 (H) 06/26/2018   GLUCOSE 89 06/26/2018    Discharge Medications:   Allergies as of 07/01/2018      Reactions   Penicillins Rash   Did it involve swelling of the face/tongue/throat, SOB, or low BP? Yes Did it involve sudden or severe rash/hives, skin peeling, or any reaction on the inside of your mouth or nose? No Did you need to seek medical attention at a hospital or doctor's office? Yes When did it last happen?Late 1980s If all above answers are "NO", may proceed with cephalosporin use.   Dexilant [dexlansoprazole] Other (See Comments)   Stomach pain and cramps   Prilosec [omeprazole] Other (See Comments)   Stomach pain and cramps   Shellfish Allergy Diarrhea   Oysters and mussles   Ceclor [cefaclor] Rash   Hydrochlorothiazide Rash   Latex Rash   Contact dermatitis   Relafen [nabumetone] Rash   Also, no NSAIDs due to kidney function   Sulfa Antibiotics Rash      Medication List    TAKE these medications  acetaminophen 500 MG tablet Commonly known as:  TYLENOL Take 500-1,000 mg by mouth every 6 (six) hours as needed for mild pain or headache.   amLODipine 10 MG tablet Commonly known as:  NORVASC Take 10 mg by mouth daily.   aspirin EC 325 MG tablet Take 1 tablet (325 mg total) by mouth 2 (two) times daily after a meal. Take x 1 month post op to decrease risk of blood clots.   calcium carbonate 500 MG chewable tablet Commonly known as:  TUMS - dosed in mg elemental calcium Chew 3 tablets by mouth at bedtime.   cetirizine 10 MG tablet Commonly known as:  ZYRTEC Take 10 mg by mouth at bedtime.   docusate sodium 100 MG capsule Commonly known as:  Colace Take 1 capsule (100 mg total) by mouth 2 (two) times daily.    Estradiol 10 MCG Tabs vaginal tablet Place 10 mcg vaginally 2 (two) times a week.   estrogens-methylTEST 1.25-2.5 MG tablet Commonly known as:  ESTRATEST Take 1 tablet by mouth daily.   famotidine 20 MG tablet Commonly known as:  PEPCID Take 20 mg by mouth 2 (two) times daily.   labetalol 100 MG tablet Commonly known as:  NORMODYNE Take 100 mg by mouth 2 (two) times daily.   lamoTRIgine 150 MG tablet Commonly known as:  LAMICTAL Take 150 mg by mouth at bedtime.   mometasone 50 MCG/ACT nasal spray Commonly known as:  NASONEX Place 2 sprays into the nose 2 (two) times a day.   nefazodone 250 MG tablet Commonly known as:  SERZONE Take 250 mg by mouth 2 (two) times a day.   oxyCODONE-acetaminophen 5-325 MG tablet Commonly known as:  PERCOCET/ROXICET Take 1-2 tablets by mouth every 6 (six) hours as needed for severe pain.   rosuvastatin 10 MG tablet Commonly known as:  CRESTOR Take 10 mg by mouth at bedtime.   SYSTANE COMPLETE OP Place 1 drop into both eyes at bedtime.   tiZANidine 2 MG tablet Commonly known as:  ZANAFLEX Take 1 tablet (2 mg total) by mouth every 8 (eight) hours as needed for muscle spasms.   traMADol 50 MG tablet Commonly known as:  ULTRAM Take 50 mg by mouth every 8 (eight) hours as needed for moderate pain.   Vitamin D 50 MCG (2000 UT) tablet Take 4,000 Units by mouth daily.       Diagnostic Studies: Dg Chest 2 View  Result Date: 06/26/2018 CLINICAL DATA:  Hip replacement.  Pain. EXAM: CHEST - 2 VIEW COMPARISON:  Chest x-ray dated 02/17/2005. FINDINGS: The heart size and mediastinal contours are within normal limits. Both lungs are clear. The visualized skeletal structures are unremarkable. IMPRESSION: No active cardiopulmonary disease. Electronically Signed   By: Katherine Mantle M.D.   On: 06/26/2018 20:18   Dg C-arm 1-60 Min-no Report  Result Date: 06/30/2018 Fluoroscopy was utilized by the requesting physician.  No radiographic  interpretation.   Dg Hip Operative Unilat W Or W/o Pelvis Left  Result Date: 06/30/2018 CLINICAL DATA:  Left total hip arthroplasty. EXAM: OPERATIVE LEFT HIP (WITH PELVIS IF PERFORMED) 2 VIEWS TECHNIQUE: Fluoroscopic spot image(s) were submitted for interpretation post-operatively. COMPARISON:  Left hip MRI 12/12/2017 FINDINGS: 2 intraoperative fluoroscopic images are provided. Left total hip arthroplasty has been performed, and the prosthetic components are approximated with one another without acute fracture identified on these limited images. IMPRESSION: Intraoperative images during left total hip arthroplasty. Electronically Signed   By: Sebastian Ache M.D.   On:  06/30/2018 12:02    Disposition:     Follow-up Information    Jodi Geralds, MD. Schedule an appointment as soon as possible for a visit in 2 weeks.   Specialty:  Orthopedic Surgery Contact information: [address] [place] [address] [phone]            Signed: Jodi Marble 07/01/2018, 7:45 AM

## 2018-07-01 NOTE — Progress Notes (Signed)
     Home health agencies that serve [phone]. Your favorite home health agencies  [place] of Patient Care Rating Patient Survey Summary Rating  [place]  [phone] 4 out of 5 stars 4 out of [place]  [phone] 3 out of 5 stars 4 out of [place]  [phone] 4  out of 5 stars 3 out of [place]  [phone] 4 out of 5 stars 4 out of [place]  [phone] 4  out of 5 stars 4 out of [place]  [phone] 4 out of 5 stars 4 out of 5 stars  [facility] [place]  [phone] 3  out of 5 stars 4 out of [place]  [phone] 3 out of 5 stars 4 out of 5 stars  [facility]  [phone] 4 out of 5 stars Not Available  [facility]  ([phone]) [phone] 3  out of 5 stars 3 out of [place]  [phone] 4 out of 5 stars 4 out of [place]  [phone] 3  out of 5 stars 4 out of [place]  [phone] 3  out of 5 stars 3 out of [place]  [phone] 4  out of 5 stars 3 out of [place]  [phone] 4  out of 5 stars 2 out of [place] number Footnote as displayed on [place]  1 This agency provides services under a federal waiver program to non-traditional, chronic long term population.  2 This agency provides services to a special needs population.  3 Not Available.  4 The number of patient episodes for this measure is too small to report.  5 This measure currently does not have data or provider has been certified/recertified for less than 6 months.  6 The national average for this measure is not provided because of  state-to-state differences in data collection.  7 Medicare is not displaying rates for this measure for any home health agency, because of an issue with the data.  8 There were problems with the data and they are being corrected.  9 Zero, or very few, patients met the survey's rules for inclusion. The scores shown, if any, reflect a very small number of surveys and may not accurately tell how an agency is doing.  10 Survey results are based on less than 12 months of data.  11 Fewer than 70 patients completed the survey. Use the scores shown, if any, with caution as the number of surveys may be too low to accurately tell how an agency is doing.  12 No survey results are available for this period.  13 Data suppressed by CMS for one or more quarters.

## 2018-07-01 NOTE — Plan of Care (Signed)
Pt stable this am with no needs. No changes to note. Pt hoping to go home today. Rn will continue to monitor.

## 2018-07-01 NOTE — Progress Notes (Signed)
Physical Therapy Treatment Patient Details Name: Jacqueline Riggs MRN: [id number] DOB: 03-Feb-1956 Today's Date: 07/01/2018    History of Present Illness 63 yo female s/p L DA-THA on 06/30/18. PMH includes OA, CKD III, depression, HTN, R shoulder arthroplasty.     PT Comments    Pt progressing well. Fatigued after amb and stairs--will see a second time for review of HEP   Follow Up Recommendations  Follow surgeon's recommendation for DC plan and follow-up therapies;Supervision for mobility/OOB     Equipment Recommendations  3in1 (PT);Rolling walker with 5" wheels    Recommendations for Other Services       Precautions / Restrictions Precautions Precautions: Fall Restrictions Weight Bearing Restrictions: No Other Position/Activity Restrictions: WBAT     Mobility  Bed Mobility Overal bed mobility: Needs Assistance Bed Mobility: Supine to Sit;Sit to Supine     Supine to sit: HOB elevated;Min assist;Min guard Sit to supine: Min assist;Min guard   General bed mobility comments: min to min/guard with LLE guidance on adn off bed, instructed in use of gait belt as leg lifter   Transfers Overall transfer level: Needs assistance Equipment used: Rolling walker (2 wheeled) Transfers: Sit to/from Stand Sit to Stand: Min guard         General transfer comment: cues for hand placement  Ambulation/Gait Ambulation/Gait assistance: Min guard Gait Distance (Feet): 50 Feet Assistive device: Rolling walker (2 wheeled) Gait Pattern/deviations: Step-to pattern;Decreased step length - right;Decreased step length - left     General Gait Details: Min guard for safety, verbal cuing for placement in RW, sequencing, turning RW.    Stairs Stairs: Yes Stairs assistance: Min assist Stair Management: One rail Right;Step to pattern;Forwards;With cane(one rail/then cane and rail) Number of Stairs: 5(x2) General stair comments: cues for sequence and technique   Wheelchair Mobility     Modified Rankin (Stroke Patients Only)       Balance                                            Cognition Arousal/Alertness: Awake/alert Behavior During Therapy: WFL for tasks assessed/performed Overall Cognitive Status: Within Functional Limits for tasks assessed                                        Exercises      General Comments        Pertinent Vitals/Pain Pain Assessment: 0-10 Pain Score: 5  Pain Location: L hip  Pain Descriptors / Indicators: Burning;Aching Pain Intervention(s): Limited activity within patient's tolerance;Monitored during session;Premedicated before session    Home Living                      Prior Function            PT Goals (current goals can now be found in the care plan section) Acute Rehab PT Goals Patient Stated Goal: go home PT Goal Formulation: With patient Time For Goal Achievement: 07/07/18 Potential to Achieve Goals: Good Progress towards PT goals: Progressing toward goals    Frequency    7X/week      PT Plan Current plan remains appropriate    Co-evaluation              AM-PAC PT "6 Clicks" Mobility   Outcome  Measure  Help needed turning from your back to your side while in a flat bed without using bedrails?: A Little Help needed moving from lying on your back to sitting on the side of a flat bed without using bedrails?: A Little Help needed moving to and from a bed to a chair (including a wheelchair)?: A Little Help needed standing up from a chair using your arms (e.g., wheelchair or bedside chair)?: A Little Help needed to walk in hospital room?: A Little Help needed climbing 3-5 steps with a railing? : A Little 6 Click Score: 18    End of Session Equipment Utilized During Treatment: Gait belt Activity Tolerance: Patient tolerated treatment well Patient left: in bed;with call bell/phone within reach Nurse Communication: Mobility status PT Visit Diagnosis:  Other abnormalities of gait and mobility (R26.89);Difficulty in walking, not elsewhere classified (R26.2)     Time: [id number] PT Time Calculation (min) (ACUTE ONLY): 21 min  Charges:  $Gait Training: 8-22 mins                     Drucilla Chaletara Montavius Subramaniam, PT  Pager: [phone] Acute Rehab Dept [facility]): [phone]   07/01/2018    [facility],Jacqueline Riggs 07/01/2018, 12:47 PM

## 2018-07-02 DIAGNOSIS — I129 Hypertensive chronic kidney disease with stage 1 through stage 4 chronic kidney disease, or unspecified chronic kidney disease: Secondary | ICD-10-CM | POA: Diagnosis not present

## 2018-07-02 DIAGNOSIS — Z96642 Presence of left artificial hip joint: Secondary | ICD-10-CM | POA: Diagnosis not present

## 2018-07-02 DIAGNOSIS — F329 Major depressive disorder, single episode, unspecified: Secondary | ICD-10-CM | POA: Diagnosis not present

## 2018-07-02 DIAGNOSIS — G4730 Sleep apnea, unspecified: Secondary | ICD-10-CM | POA: Diagnosis not present

## 2018-07-02 DIAGNOSIS — N183 Chronic kidney disease, stage 3 (moderate): Secondary | ICD-10-CM | POA: Diagnosis not present

## 2018-07-02 DIAGNOSIS — Z87891 Personal history of nicotine dependence: Secondary | ICD-10-CM | POA: Diagnosis not present

## 2018-07-02 DIAGNOSIS — Z471 Aftercare following joint replacement surgery: Secondary | ICD-10-CM | POA: Diagnosis not present

## 2018-07-03 ENCOUNTER — Encounter (HOSPITAL_COMMUNITY): Payer: Self-pay | Admitting: Orthopedic Surgery

## 2018-07-07 DIAGNOSIS — Z471 Aftercare following joint replacement surgery: Secondary | ICD-10-CM | POA: Diagnosis not present

## 2018-07-07 DIAGNOSIS — Z96642 Presence of left artificial hip joint: Secondary | ICD-10-CM | POA: Diagnosis not present

## 2018-07-07 DIAGNOSIS — F329 Major depressive disorder, single episode, unspecified: Secondary | ICD-10-CM | POA: Diagnosis not present

## 2018-07-07 DIAGNOSIS — N183 Chronic kidney disease, stage 3 (moderate): Secondary | ICD-10-CM | POA: Diagnosis not present

## 2018-07-07 DIAGNOSIS — Z87891 Personal history of nicotine dependence: Secondary | ICD-10-CM | POA: Diagnosis not present

## 2018-07-07 DIAGNOSIS — I129 Hypertensive chronic kidney disease with stage 1 through stage 4 chronic kidney disease, or unspecified chronic kidney disease: Secondary | ICD-10-CM | POA: Diagnosis not present

## 2018-07-07 DIAGNOSIS — G4730 Sleep apnea, unspecified: Secondary | ICD-10-CM | POA: Diagnosis not present

## 2018-07-08 DIAGNOSIS — Z96642 Presence of left artificial hip joint: Secondary | ICD-10-CM | POA: Diagnosis not present

## 2018-07-08 DIAGNOSIS — Z471 Aftercare following joint replacement surgery: Secondary | ICD-10-CM | POA: Diagnosis not present

## 2018-07-08 DIAGNOSIS — F329 Major depressive disorder, single episode, unspecified: Secondary | ICD-10-CM | POA: Diagnosis not present

## 2018-07-08 DIAGNOSIS — G4730 Sleep apnea, unspecified: Secondary | ICD-10-CM | POA: Diagnosis not present

## 2018-07-08 DIAGNOSIS — Z87891 Personal history of nicotine dependence: Secondary | ICD-10-CM | POA: Diagnosis not present

## 2018-07-08 DIAGNOSIS — I129 Hypertensive chronic kidney disease with stage 1 through stage 4 chronic kidney disease, or unspecified chronic kidney disease: Secondary | ICD-10-CM | POA: Diagnosis not present

## 2018-07-08 DIAGNOSIS — N183 Chronic kidney disease, stage 3 (moderate): Secondary | ICD-10-CM | POA: Diagnosis not present

## 2018-07-10 DIAGNOSIS — F329 Major depressive disorder, single episode, unspecified: Secondary | ICD-10-CM | POA: Diagnosis not present

## 2018-07-10 DIAGNOSIS — I129 Hypertensive chronic kidney disease with stage 1 through stage 4 chronic kidney disease, or unspecified chronic kidney disease: Secondary | ICD-10-CM | POA: Diagnosis not present

## 2018-07-10 DIAGNOSIS — Z471 Aftercare following joint replacement surgery: Secondary | ICD-10-CM | POA: Diagnosis not present

## 2018-07-10 DIAGNOSIS — Z96642 Presence of left artificial hip joint: Secondary | ICD-10-CM | POA: Diagnosis not present

## 2018-07-10 DIAGNOSIS — G4730 Sleep apnea, unspecified: Secondary | ICD-10-CM | POA: Diagnosis not present

## 2018-07-10 DIAGNOSIS — N183 Chronic kidney disease, stage 3 (moderate): Secondary | ICD-10-CM | POA: Diagnosis not present

## 2018-07-10 DIAGNOSIS — Z87891 Personal history of nicotine dependence: Secondary | ICD-10-CM | POA: Diagnosis not present

## 2018-07-11 DIAGNOSIS — M1612 Unilateral primary osteoarthritis, left hip: Secondary | ICD-10-CM | POA: Diagnosis not present

## 2018-07-11 DIAGNOSIS — Z471 Aftercare following joint replacement surgery: Secondary | ICD-10-CM | POA: Diagnosis not present

## 2018-07-12 DIAGNOSIS — N183 Chronic kidney disease, stage 3 (moderate): Secondary | ICD-10-CM | POA: Diagnosis not present

## 2018-07-12 DIAGNOSIS — I129 Hypertensive chronic kidney disease with stage 1 through stage 4 chronic kidney disease, or unspecified chronic kidney disease: Secondary | ICD-10-CM | POA: Diagnosis not present

## 2018-07-12 DIAGNOSIS — F329 Major depressive disorder, single episode, unspecified: Secondary | ICD-10-CM | POA: Diagnosis not present

## 2018-07-12 DIAGNOSIS — Z87891 Personal history of nicotine dependence: Secondary | ICD-10-CM | POA: Diagnosis not present

## 2018-07-12 DIAGNOSIS — Z471 Aftercare following joint replacement surgery: Secondary | ICD-10-CM | POA: Diagnosis not present

## 2018-07-12 DIAGNOSIS — G4730 Sleep apnea, unspecified: Secondary | ICD-10-CM | POA: Diagnosis not present

## 2018-07-12 DIAGNOSIS — Z96642 Presence of left artificial hip joint: Secondary | ICD-10-CM | POA: Diagnosis not present

## 2018-07-14 DIAGNOSIS — F329 Major depressive disorder, single episode, unspecified: Secondary | ICD-10-CM | POA: Diagnosis not present

## 2018-07-14 DIAGNOSIS — Z471 Aftercare following joint replacement surgery: Secondary | ICD-10-CM | POA: Diagnosis not present

## 2018-07-14 DIAGNOSIS — Z87891 Personal history of nicotine dependence: Secondary | ICD-10-CM | POA: Diagnosis not present

## 2018-07-14 DIAGNOSIS — Z96642 Presence of left artificial hip joint: Secondary | ICD-10-CM | POA: Diagnosis not present

## 2018-07-14 DIAGNOSIS — N183 Chronic kidney disease, stage 3 (moderate): Secondary | ICD-10-CM | POA: Diagnosis not present

## 2018-07-14 DIAGNOSIS — I129 Hypertensive chronic kidney disease with stage 1 through stage 4 chronic kidney disease, or unspecified chronic kidney disease: Secondary | ICD-10-CM | POA: Diagnosis not present

## 2018-07-14 DIAGNOSIS — G4730 Sleep apnea, unspecified: Secondary | ICD-10-CM | POA: Diagnosis not present

## 2018-07-17 DIAGNOSIS — Z96642 Presence of left artificial hip joint: Secondary | ICD-10-CM | POA: Diagnosis not present

## 2018-07-17 DIAGNOSIS — F331 Major depressive disorder, recurrent, moderate: Secondary | ICD-10-CM | POA: Diagnosis not present

## 2018-07-17 DIAGNOSIS — R262 Difficulty in walking, not elsewhere classified: Secondary | ICD-10-CM | POA: Diagnosis not present

## 2018-07-17 DIAGNOSIS — Z471 Aftercare following joint replacement surgery: Secondary | ICD-10-CM | POA: Diagnosis not present

## 2018-07-17 DIAGNOSIS — F411 Generalized anxiety disorder: Secondary | ICD-10-CM | POA: Diagnosis not present

## 2018-07-19 DIAGNOSIS — Z96642 Presence of left artificial hip joint: Secondary | ICD-10-CM | POA: Diagnosis not present

## 2018-07-19 DIAGNOSIS — Z471 Aftercare following joint replacement surgery: Secondary | ICD-10-CM | POA: Diagnosis not present

## 2018-07-19 DIAGNOSIS — R262 Difficulty in walking, not elsewhere classified: Secondary | ICD-10-CM | POA: Diagnosis not present

## 2018-07-24 DIAGNOSIS — Z96642 Presence of left artificial hip joint: Secondary | ICD-10-CM | POA: Diagnosis not present

## 2018-07-24 DIAGNOSIS — R262 Difficulty in walking, not elsewhere classified: Secondary | ICD-10-CM | POA: Diagnosis not present

## 2018-07-24 DIAGNOSIS — Z471 Aftercare following joint replacement surgery: Secondary | ICD-10-CM | POA: Diagnosis not present

## 2018-07-26 DIAGNOSIS — R262 Difficulty in walking, not elsewhere classified: Secondary | ICD-10-CM | POA: Diagnosis not present

## 2018-07-26 DIAGNOSIS — Z96642 Presence of left artificial hip joint: Secondary | ICD-10-CM | POA: Diagnosis not present

## 2018-07-26 DIAGNOSIS — Z471 Aftercare following joint replacement surgery: Secondary | ICD-10-CM | POA: Diagnosis not present

## 2018-07-31 DIAGNOSIS — Z471 Aftercare following joint replacement surgery: Secondary | ICD-10-CM | POA: Diagnosis not present

## 2018-07-31 DIAGNOSIS — R262 Difficulty in walking, not elsewhere classified: Secondary | ICD-10-CM | POA: Diagnosis not present

## 2018-07-31 DIAGNOSIS — Z96642 Presence of left artificial hip joint: Secondary | ICD-10-CM | POA: Diagnosis not present

## 2018-08-02 DIAGNOSIS — Z96642 Presence of left artificial hip joint: Secondary | ICD-10-CM | POA: Diagnosis not present

## 2018-08-02 DIAGNOSIS — Z471 Aftercare following joint replacement surgery: Secondary | ICD-10-CM | POA: Diagnosis not present

## 2018-08-02 DIAGNOSIS — R262 Difficulty in walking, not elsewhere classified: Secondary | ICD-10-CM | POA: Diagnosis not present

## 2018-08-07 DIAGNOSIS — Z96642 Presence of left artificial hip joint: Secondary | ICD-10-CM | POA: Diagnosis not present

## 2018-08-07 DIAGNOSIS — Z471 Aftercare following joint replacement surgery: Secondary | ICD-10-CM | POA: Diagnosis not present

## 2018-08-07 DIAGNOSIS — R262 Difficulty in walking, not elsewhere classified: Secondary | ICD-10-CM | POA: Diagnosis not present

## 2018-08-09 DIAGNOSIS — R262 Difficulty in walking, not elsewhere classified: Secondary | ICD-10-CM | POA: Diagnosis not present

## 2018-08-09 DIAGNOSIS — Z471 Aftercare following joint replacement surgery: Secondary | ICD-10-CM | POA: Diagnosis not present

## 2018-08-09 DIAGNOSIS — Z96642 Presence of left artificial hip joint: Secondary | ICD-10-CM | POA: Diagnosis not present

## 2018-08-14 DIAGNOSIS — Z96642 Presence of left artificial hip joint: Secondary | ICD-10-CM | POA: Diagnosis not present

## 2018-08-14 DIAGNOSIS — Z471 Aftercare following joint replacement surgery: Secondary | ICD-10-CM | POA: Diagnosis not present

## 2018-08-14 DIAGNOSIS — R262 Difficulty in walking, not elsewhere classified: Secondary | ICD-10-CM | POA: Diagnosis not present

## 2018-08-15 DIAGNOSIS — M1812 Unilateral primary osteoarthritis of first carpometacarpal joint, left hand: Secondary | ICD-10-CM | POA: Diagnosis not present

## 2018-08-15 DIAGNOSIS — M1811 Unilateral primary osteoarthritis of first carpometacarpal joint, right hand: Secondary | ICD-10-CM | POA: Diagnosis not present

## 2018-08-16 DIAGNOSIS — Z96642 Presence of left artificial hip joint: Secondary | ICD-10-CM | POA: Diagnosis not present

## 2018-08-16 DIAGNOSIS — Z471 Aftercare following joint replacement surgery: Secondary | ICD-10-CM | POA: Diagnosis not present

## 2018-08-16 DIAGNOSIS — R262 Difficulty in walking, not elsewhere classified: Secondary | ICD-10-CM | POA: Diagnosis not present

## 2018-08-22 DIAGNOSIS — Z471 Aftercare following joint replacement surgery: Secondary | ICD-10-CM | POA: Diagnosis not present

## 2018-08-22 DIAGNOSIS — R262 Difficulty in walking, not elsewhere classified: Secondary | ICD-10-CM | POA: Diagnosis not present

## 2018-08-22 DIAGNOSIS — Z96642 Presence of left artificial hip joint: Secondary | ICD-10-CM | POA: Diagnosis not present

## 2018-08-24 DIAGNOSIS — Z471 Aftercare following joint replacement surgery: Secondary | ICD-10-CM | POA: Diagnosis not present

## 2018-08-24 DIAGNOSIS — Z96642 Presence of left artificial hip joint: Secondary | ICD-10-CM | POA: Diagnosis not present

## 2018-08-24 DIAGNOSIS — R262 Difficulty in walking, not elsewhere classified: Secondary | ICD-10-CM | POA: Diagnosis not present

## 2018-08-28 DIAGNOSIS — Z96642 Presence of left artificial hip joint: Secondary | ICD-10-CM | POA: Diagnosis not present

## 2018-08-28 DIAGNOSIS — Z471 Aftercare following joint replacement surgery: Secondary | ICD-10-CM | POA: Diagnosis not present

## 2018-08-28 DIAGNOSIS — R262 Difficulty in walking, not elsewhere classified: Secondary | ICD-10-CM | POA: Diagnosis not present

## 2018-08-28 DIAGNOSIS — M1811 Unilateral primary osteoarthritis of first carpometacarpal joint, right hand: Secondary | ICD-10-CM | POA: Diagnosis not present

## 2018-08-30 DIAGNOSIS — Z471 Aftercare following joint replacement surgery: Secondary | ICD-10-CM | POA: Diagnosis not present

## 2018-08-30 DIAGNOSIS — R262 Difficulty in walking, not elsewhere classified: Secondary | ICD-10-CM | POA: Diagnosis not present

## 2018-08-30 DIAGNOSIS — Z96642 Presence of left artificial hip joint: Secondary | ICD-10-CM | POA: Diagnosis not present

## 2018-09-04 DIAGNOSIS — G4733 Obstructive sleep apnea (adult) (pediatric): Secondary | ICD-10-CM | POA: Diagnosis not present

## 2018-09-04 DIAGNOSIS — R262 Difficulty in walking, not elsewhere classified: Secondary | ICD-10-CM | POA: Diagnosis not present

## 2018-09-04 DIAGNOSIS — Z471 Aftercare following joint replacement surgery: Secondary | ICD-10-CM | POA: Diagnosis not present

## 2018-09-04 DIAGNOSIS — Z96642 Presence of left artificial hip joint: Secondary | ICD-10-CM | POA: Diagnosis not present

## 2018-09-06 DIAGNOSIS — Z471 Aftercare following joint replacement surgery: Secondary | ICD-10-CM | POA: Diagnosis not present

## 2018-09-06 DIAGNOSIS — R262 Difficulty in walking, not elsewhere classified: Secondary | ICD-10-CM | POA: Diagnosis not present

## 2018-09-06 DIAGNOSIS — Z96642 Presence of left artificial hip joint: Secondary | ICD-10-CM | POA: Diagnosis not present

## 2018-09-12 DIAGNOSIS — Z471 Aftercare following joint replacement surgery: Secondary | ICD-10-CM | POA: Diagnosis not present

## 2018-09-12 DIAGNOSIS — Z96642 Presence of left artificial hip joint: Secondary | ICD-10-CM | POA: Diagnosis not present

## 2018-09-12 DIAGNOSIS — R262 Difficulty in walking, not elsewhere classified: Secondary | ICD-10-CM | POA: Diagnosis not present

## 2018-09-14 DIAGNOSIS — R262 Difficulty in walking, not elsewhere classified: Secondary | ICD-10-CM | POA: Diagnosis not present

## 2018-09-14 DIAGNOSIS — Z471 Aftercare following joint replacement surgery: Secondary | ICD-10-CM | POA: Diagnosis not present

## 2018-09-14 DIAGNOSIS — Z96642 Presence of left artificial hip joint: Secondary | ICD-10-CM | POA: Diagnosis not present

## 2018-09-19 DIAGNOSIS — Z96642 Presence of left artificial hip joint: Secondary | ICD-10-CM | POA: Diagnosis not present

## 2018-09-19 DIAGNOSIS — R262 Difficulty in walking, not elsewhere classified: Secondary | ICD-10-CM | POA: Diagnosis not present

## 2018-09-19 DIAGNOSIS — Z471 Aftercare following joint replacement surgery: Secondary | ICD-10-CM | POA: Diagnosis not present

## 2018-09-21 DIAGNOSIS — Z96642 Presence of left artificial hip joint: Secondary | ICD-10-CM | POA: Diagnosis not present

## 2018-09-21 DIAGNOSIS — Z471 Aftercare following joint replacement surgery: Secondary | ICD-10-CM | POA: Diagnosis not present

## 2018-09-21 DIAGNOSIS — R262 Difficulty in walking, not elsewhere classified: Secondary | ICD-10-CM | POA: Diagnosis not present

## 2018-09-25 DIAGNOSIS — Z471 Aftercare following joint replacement surgery: Secondary | ICD-10-CM | POA: Diagnosis not present

## 2018-09-25 DIAGNOSIS — R262 Difficulty in walking, not elsewhere classified: Secondary | ICD-10-CM | POA: Diagnosis not present

## 2018-09-25 DIAGNOSIS — Z96642 Presence of left artificial hip joint: Secondary | ICD-10-CM | POA: Diagnosis not present

## 2018-09-27 DIAGNOSIS — Z471 Aftercare following joint replacement surgery: Secondary | ICD-10-CM | POA: Diagnosis not present

## 2018-09-27 DIAGNOSIS — R262 Difficulty in walking, not elsewhere classified: Secondary | ICD-10-CM | POA: Diagnosis not present

## 2018-09-27 DIAGNOSIS — Z96642 Presence of left artificial hip joint: Secondary | ICD-10-CM | POA: Diagnosis not present

## 2018-09-27 DIAGNOSIS — G4733 Obstructive sleep apnea (adult) (pediatric): Secondary | ICD-10-CM | POA: Diagnosis not present

## 2018-10-02 DIAGNOSIS — R262 Difficulty in walking, not elsewhere classified: Secondary | ICD-10-CM | POA: Diagnosis not present

## 2018-10-02 DIAGNOSIS — Z96642 Presence of left artificial hip joint: Secondary | ICD-10-CM | POA: Diagnosis not present

## 2018-10-02 DIAGNOSIS — Z471 Aftercare following joint replacement surgery: Secondary | ICD-10-CM | POA: Diagnosis not present

## 2018-10-04 DIAGNOSIS — Z471 Aftercare following joint replacement surgery: Secondary | ICD-10-CM | POA: Diagnosis not present

## 2018-10-04 DIAGNOSIS — Z96642 Presence of left artificial hip joint: Secondary | ICD-10-CM | POA: Diagnosis not present

## 2018-10-04 DIAGNOSIS — R262 Difficulty in walking, not elsewhere classified: Secondary | ICD-10-CM | POA: Diagnosis not present

## 2018-10-05 DIAGNOSIS — F411 Generalized anxiety disorder: Secondary | ICD-10-CM | POA: Diagnosis not present

## 2018-10-05 DIAGNOSIS — F331 Major depressive disorder, recurrent, moderate: Secondary | ICD-10-CM | POA: Diagnosis not present

## 2018-10-09 DIAGNOSIS — Z96642 Presence of left artificial hip joint: Secondary | ICD-10-CM | POA: Diagnosis not present

## 2018-10-09 DIAGNOSIS — R262 Difficulty in walking, not elsewhere classified: Secondary | ICD-10-CM | POA: Diagnosis not present

## 2018-10-09 DIAGNOSIS — Z471 Aftercare following joint replacement surgery: Secondary | ICD-10-CM | POA: Diagnosis not present

## 2018-10-11 DIAGNOSIS — Z96642 Presence of left artificial hip joint: Secondary | ICD-10-CM | POA: Diagnosis not present

## 2018-10-11 DIAGNOSIS — Z471 Aftercare following joint replacement surgery: Secondary | ICD-10-CM | POA: Diagnosis not present

## 2018-10-11 DIAGNOSIS — R262 Difficulty in walking, not elsewhere classified: Secondary | ICD-10-CM | POA: Diagnosis not present

## 2018-10-17 DIAGNOSIS — M25552 Pain in left hip: Secondary | ICD-10-CM | POA: Diagnosis not present

## 2018-10-17 DIAGNOSIS — Z96642 Presence of left artificial hip joint: Secondary | ICD-10-CM | POA: Diagnosis not present

## 2018-10-17 DIAGNOSIS — Z471 Aftercare following joint replacement surgery: Secondary | ICD-10-CM | POA: Diagnosis not present

## 2018-10-17 DIAGNOSIS — R262 Difficulty in walking, not elsewhere classified: Secondary | ICD-10-CM | POA: Diagnosis not present

## 2018-10-19 DIAGNOSIS — Z96642 Presence of left artificial hip joint: Secondary | ICD-10-CM | POA: Diagnosis not present

## 2018-10-19 DIAGNOSIS — Z471 Aftercare following joint replacement surgery: Secondary | ICD-10-CM | POA: Diagnosis not present

## 2018-10-19 DIAGNOSIS — R262 Difficulty in walking, not elsewhere classified: Secondary | ICD-10-CM | POA: Diagnosis not present

## 2018-11-15 DIAGNOSIS — Z1231 Encounter for screening mammogram for malignant neoplasm of breast: Secondary | ICD-10-CM | POA: Diagnosis not present

## 2018-11-15 DIAGNOSIS — Z6837 Body mass index (BMI) 37.0-37.9, adult: Secondary | ICD-10-CM | POA: Diagnosis not present

## 2018-11-15 DIAGNOSIS — Z01419 Encounter for gynecological examination (general) (routine) without abnormal findings: Secondary | ICD-10-CM | POA: Diagnosis not present

## 2018-12-20 DIAGNOSIS — G4733 Obstructive sleep apnea (adult) (pediatric): Secondary | ICD-10-CM | POA: Diagnosis not present

## 2019-01-04 DIAGNOSIS — F331 Major depressive disorder, recurrent, moderate: Secondary | ICD-10-CM | POA: Diagnosis not present

## 2019-01-04 DIAGNOSIS — F411 Generalized anxiety disorder: Secondary | ICD-10-CM | POA: Diagnosis not present

## 2019-01-08 DIAGNOSIS — J3089 Other allergic rhinitis: Secondary | ICD-10-CM | POA: Diagnosis not present

## 2019-01-08 DIAGNOSIS — J309 Allergic rhinitis, unspecified: Secondary | ICD-10-CM | POA: Diagnosis not present

## 2019-01-18 DIAGNOSIS — Z96642 Presence of left artificial hip joint: Secondary | ICD-10-CM | POA: Diagnosis not present

## 2019-05-24 DIAGNOSIS — M1812 Unilateral primary osteoarthritis of first carpometacarpal joint, left hand: Secondary | ICD-10-CM | POA: Diagnosis not present

## 2019-05-24 DIAGNOSIS — R2232 Localized swelling, mass and lump, left upper limb: Secondary | ICD-10-CM | POA: Diagnosis not present

## 2019-06-26 DIAGNOSIS — E559 Vitamin D deficiency, unspecified: Secondary | ICD-10-CM | POA: Diagnosis not present

## 2019-06-26 DIAGNOSIS — N189 Chronic kidney disease, unspecified: Secondary | ICD-10-CM | POA: Diagnosis not present

## 2019-06-26 DIAGNOSIS — G4733 Obstructive sleep apnea (adult) (pediatric): Secondary | ICD-10-CM | POA: Diagnosis not present

## 2019-06-26 DIAGNOSIS — M150 Primary generalized (osteo)arthritis: Secondary | ICD-10-CM | POA: Diagnosis not present

## 2019-06-26 DIAGNOSIS — I10 Essential (primary) hypertension: Secondary | ICD-10-CM | POA: Diagnosis not present

## 2019-06-26 DIAGNOSIS — E782 Mixed hyperlipidemia: Secondary | ICD-10-CM | POA: Diagnosis not present

## 2019-07-05 DIAGNOSIS — F331 Major depressive disorder, recurrent, moderate: Secondary | ICD-10-CM | POA: Diagnosis not present

## 2019-07-05 DIAGNOSIS — F411 Generalized anxiety disorder: Secondary | ICD-10-CM | POA: Diagnosis not present

## 2019-07-09 DIAGNOSIS — M25552 Pain in left hip: Secondary | ICD-10-CM | POA: Diagnosis not present

## 2019-07-30 DIAGNOSIS — G4733 Obstructive sleep apnea (adult) (pediatric): Secondary | ICD-10-CM | POA: Diagnosis not present

## 2019-07-30 DIAGNOSIS — J342 Deviated nasal septum: Secondary | ICD-10-CM | POA: Diagnosis not present

## 2019-07-30 DIAGNOSIS — J343 Hypertrophy of nasal turbinates: Secondary | ICD-10-CM | POA: Diagnosis not present

## 2019-09-06 DIAGNOSIS — F411 Generalized anxiety disorder: Secondary | ICD-10-CM | POA: Diagnosis not present

## 2019-09-06 DIAGNOSIS — F331 Major depressive disorder, recurrent, moderate: Secondary | ICD-10-CM | POA: Diagnosis not present

## 2019-09-25 DIAGNOSIS — M79644 Pain in right finger(s): Secondary | ICD-10-CM | POA: Diagnosis not present

## 2019-10-29 DIAGNOSIS — L0390 Cellulitis, unspecified: Secondary | ICD-10-CM | POA: Diagnosis not present

## 2019-11-15 DIAGNOSIS — E782 Mixed hyperlipidemia: Secondary | ICD-10-CM | POA: Diagnosis not present

## 2019-11-15 DIAGNOSIS — I272 Pulmonary hypertension, unspecified: Secondary | ICD-10-CM | POA: Diagnosis not present

## 2019-11-15 DIAGNOSIS — E785 Hyperlipidemia, unspecified: Secondary | ICD-10-CM | POA: Diagnosis not present

## 2019-11-15 DIAGNOSIS — I10 Essential (primary) hypertension: Secondary | ICD-10-CM | POA: Diagnosis not present

## 2019-12-13 DIAGNOSIS — F331 Major depressive disorder, recurrent, moderate: Secondary | ICD-10-CM | POA: Diagnosis not present

## 2019-12-13 DIAGNOSIS — F411 Generalized anxiety disorder: Secondary | ICD-10-CM | POA: Diagnosis not present

## 2019-12-16 DIAGNOSIS — E782 Mixed hyperlipidemia: Secondary | ICD-10-CM | POA: Diagnosis not present

## 2019-12-16 DIAGNOSIS — I10 Essential (primary) hypertension: Secondary | ICD-10-CM | POA: Diagnosis not present

## 2019-12-16 DIAGNOSIS — I272 Pulmonary hypertension, unspecified: Secondary | ICD-10-CM | POA: Diagnosis not present

## 2019-12-16 DIAGNOSIS — E785 Hyperlipidemia, unspecified: Secondary | ICD-10-CM | POA: Diagnosis not present

## 2019-12-25 DIAGNOSIS — I10 Essential (primary) hypertension: Secondary | ICD-10-CM | POA: Diagnosis not present

## 2019-12-25 DIAGNOSIS — E782 Mixed hyperlipidemia: Secondary | ICD-10-CM | POA: Diagnosis not present

## 2020-01-01 DIAGNOSIS — Z01419 Encounter for gynecological examination (general) (routine) without abnormal findings: Secondary | ICD-10-CM | POA: Diagnosis not present

## 2020-01-01 DIAGNOSIS — Z6838 Body mass index (BMI) 38.0-38.9, adult: Secondary | ICD-10-CM | POA: Diagnosis not present

## 2020-01-01 DIAGNOSIS — Z1231 Encounter for screening mammogram for malignant neoplasm of breast: Secondary | ICD-10-CM | POA: Diagnosis not present

## 2020-01-23 DIAGNOSIS — M84374A Stress fracture, right foot, initial encounter for fracture: Secondary | ICD-10-CM | POA: Diagnosis not present

## 2020-02-02 DIAGNOSIS — K219 Gastro-esophageal reflux disease without esophagitis: Secondary | ICD-10-CM | POA: Diagnosis not present

## 2020-02-02 DIAGNOSIS — E785 Hyperlipidemia, unspecified: Secondary | ICD-10-CM | POA: Diagnosis not present

## 2020-02-02 DIAGNOSIS — E782 Mixed hyperlipidemia: Secondary | ICD-10-CM | POA: Diagnosis not present

## 2020-02-02 DIAGNOSIS — I10 Essential (primary) hypertension: Secondary | ICD-10-CM | POA: Diagnosis not present

## 2020-02-22 DIAGNOSIS — M722 Plantar fascial fibromatosis: Secondary | ICD-10-CM | POA: Diagnosis not present

## 2020-03-13 DIAGNOSIS — F411 Generalized anxiety disorder: Secondary | ICD-10-CM | POA: Diagnosis not present

## 2020-03-13 DIAGNOSIS — F331 Major depressive disorder, recurrent, moderate: Secondary | ICD-10-CM | POA: Diagnosis not present

## 2020-03-14 DIAGNOSIS — M79671 Pain in right foot: Secondary | ICD-10-CM | POA: Diagnosis not present

## 2020-03-18 ENCOUNTER — Other Ambulatory Visit: Payer: Self-pay | Admitting: Orthopaedic Surgery

## 2020-03-18 DIAGNOSIS — M79671 Pain in right foot: Secondary | ICD-10-CM

## 2020-03-21 ENCOUNTER — Ambulatory Visit
Admission: RE | Admit: 2020-03-21 | Discharge: 2020-03-21 | Disposition: A | Discharge status: Home / Self Care | Payer: Federal, State, Local not specified - PPO | Source: Ambulatory Visit | Attending: Orthopaedic Surgery | Admitting: Orthopaedic Surgery

## 2020-03-21 DIAGNOSIS — R600 Localized edema: Secondary | ICD-10-CM | POA: Diagnosis not present

## 2020-03-21 DIAGNOSIS — M65861 Other synovitis and tenosynovitis, right lower leg: Secondary | ICD-10-CM | POA: Diagnosis not present

## 2020-03-21 DIAGNOSIS — M7731 Calcaneal spur, right foot: Secondary | ICD-10-CM | POA: Diagnosis not present

## 2020-03-21 DIAGNOSIS — Q742 Other congenital malformations of lower limb(s), including pelvic girdle: Secondary | ICD-10-CM | POA: Diagnosis not present

## 2020-03-21 DIAGNOSIS — M79671 Pain in right foot: Secondary | ICD-10-CM

## 2020-03-24 DIAGNOSIS — S56419A Strain of extensor muscle, fascia and tendon of finger, unspecified finger at forearm level, initial encounter: Secondary | ICD-10-CM | POA: Diagnosis not present

## 2020-04-21 DIAGNOSIS — M79671 Pain in right foot: Secondary | ICD-10-CM | POA: Diagnosis not present

## 2020-04-30 DIAGNOSIS — K219 Gastro-esophageal reflux disease without esophagitis: Secondary | ICD-10-CM | POA: Diagnosis not present

## 2020-04-30 DIAGNOSIS — E785 Hyperlipidemia, unspecified: Secondary | ICD-10-CM | POA: Diagnosis not present

## 2020-04-30 DIAGNOSIS — E782 Mixed hyperlipidemia: Secondary | ICD-10-CM | POA: Diagnosis not present

## 2020-04-30 DIAGNOSIS — I10 Essential (primary) hypertension: Secondary | ICD-10-CM | POA: Diagnosis not present

## 2020-05-29 DIAGNOSIS — U071 COVID-19: Secondary | ICD-10-CM | POA: Diagnosis not present

## 2020-06-12 DIAGNOSIS — F331 Major depressive disorder, recurrent, moderate: Secondary | ICD-10-CM | POA: Diagnosis not present

## 2020-06-12 DIAGNOSIS — F411 Generalized anxiety disorder: Secondary | ICD-10-CM | POA: Diagnosis not present

## 2020-06-17 DIAGNOSIS — Z471 Aftercare following joint replacement surgery: Secondary | ICD-10-CM | POA: Diagnosis not present

## 2020-06-19 DIAGNOSIS — Z96652 Presence of left artificial knee joint: Secondary | ICD-10-CM | POA: Diagnosis not present

## 2020-06-19 DIAGNOSIS — M25561 Pain in right knee: Secondary | ICD-10-CM | POA: Diagnosis not present

## 2020-06-19 DIAGNOSIS — M25562 Pain in left knee: Secondary | ICD-10-CM | POA: Diagnosis not present

## 2020-06-19 DIAGNOSIS — Z96642 Presence of left artificial hip joint: Secondary | ICD-10-CM | POA: Diagnosis not present

## 2020-06-19 DIAGNOSIS — M25552 Pain in left hip: Secondary | ICD-10-CM | POA: Diagnosis not present

## 2020-06-19 DIAGNOSIS — Z96651 Presence of right artificial knee joint: Secondary | ICD-10-CM | POA: Diagnosis not present

## 2020-07-28 DIAGNOSIS — E785 Hyperlipidemia, unspecified: Secondary | ICD-10-CM | POA: Diagnosis not present

## 2020-07-28 DIAGNOSIS — N183 Chronic kidney disease, stage 3 unspecified: Secondary | ICD-10-CM | POA: Diagnosis not present

## 2020-07-28 DIAGNOSIS — I10 Essential (primary) hypertension: Secondary | ICD-10-CM | POA: Diagnosis not present

## 2020-07-28 DIAGNOSIS — E782 Mixed hyperlipidemia: Secondary | ICD-10-CM | POA: Diagnosis not present

## 2020-09-06 DIAGNOSIS — E785 Hyperlipidemia, unspecified: Secondary | ICD-10-CM | POA: Diagnosis not present

## 2020-09-06 DIAGNOSIS — I10 Essential (primary) hypertension: Secondary | ICD-10-CM | POA: Diagnosis not present

## 2020-09-06 DIAGNOSIS — K219 Gastro-esophageal reflux disease without esophagitis: Secondary | ICD-10-CM | POA: Diagnosis not present

## 2020-09-06 DIAGNOSIS — E782 Mixed hyperlipidemia: Secondary | ICD-10-CM | POA: Diagnosis not present

## 2020-09-11 DIAGNOSIS — F331 Major depressive disorder, recurrent, moderate: Secondary | ICD-10-CM | POA: Diagnosis not present

## 2020-09-11 DIAGNOSIS — F411 Generalized anxiety disorder: Secondary | ICD-10-CM | POA: Diagnosis not present

## 2020-10-21 DIAGNOSIS — Z0000 Encounter for general adult medical examination without abnormal findings: Secondary | ICD-10-CM | POA: Diagnosis not present

## 2020-10-21 DIAGNOSIS — Z23 Encounter for immunization: Secondary | ICD-10-CM | POA: Diagnosis not present

## 2020-10-30 DIAGNOSIS — I10 Essential (primary) hypertension: Secondary | ICD-10-CM | POA: Diagnosis not present

## 2020-10-30 DIAGNOSIS — E782 Mixed hyperlipidemia: Secondary | ICD-10-CM | POA: Diagnosis not present

## 2020-11-08 DIAGNOSIS — I10 Essential (primary) hypertension: Secondary | ICD-10-CM | POA: Diagnosis not present

## 2020-11-08 DIAGNOSIS — K219 Gastro-esophageal reflux disease without esophagitis: Secondary | ICD-10-CM | POA: Diagnosis not present

## 2020-11-08 DIAGNOSIS — E782 Mixed hyperlipidemia: Secondary | ICD-10-CM | POA: Diagnosis not present

## 2020-11-08 DIAGNOSIS — N189 Chronic kidney disease, unspecified: Secondary | ICD-10-CM | POA: Diagnosis not present

## 2020-12-11 DIAGNOSIS — F331 Major depressive disorder, recurrent, moderate: Secondary | ICD-10-CM | POA: Diagnosis not present

## 2020-12-11 DIAGNOSIS — F411 Generalized anxiety disorder: Secondary | ICD-10-CM | POA: Diagnosis not present

## 2021-01-22 DIAGNOSIS — K649 Unspecified hemorrhoids: Secondary | ICD-10-CM | POA: Diagnosis not present

## 2021-01-22 DIAGNOSIS — K5730 Diverticulosis of large intestine without perforation or abscess without bleeding: Secondary | ICD-10-CM | POA: Diagnosis not present

## 2021-01-22 DIAGNOSIS — Z8371 Family history of colonic polyps: Secondary | ICD-10-CM | POA: Diagnosis not present

## 2021-01-22 DIAGNOSIS — Z800 Family history of malignant neoplasm of digestive organs: Secondary | ICD-10-CM | POA: Diagnosis not present

## 2021-01-22 DIAGNOSIS — Z1211 Encounter for screening for malignant neoplasm of colon: Secondary | ICD-10-CM | POA: Diagnosis not present

## 2021-01-27 IMAGING — CR CHEST - 2 VIEW
2 series · 2 of 2 positions shown · non-contrast
Comparison: Chest x-ray dated 02/17/2005.

CLINICAL DATA: Hip replacement.  Pain.

EXAM:
CHEST - 2 VIEW

[w chest pa]
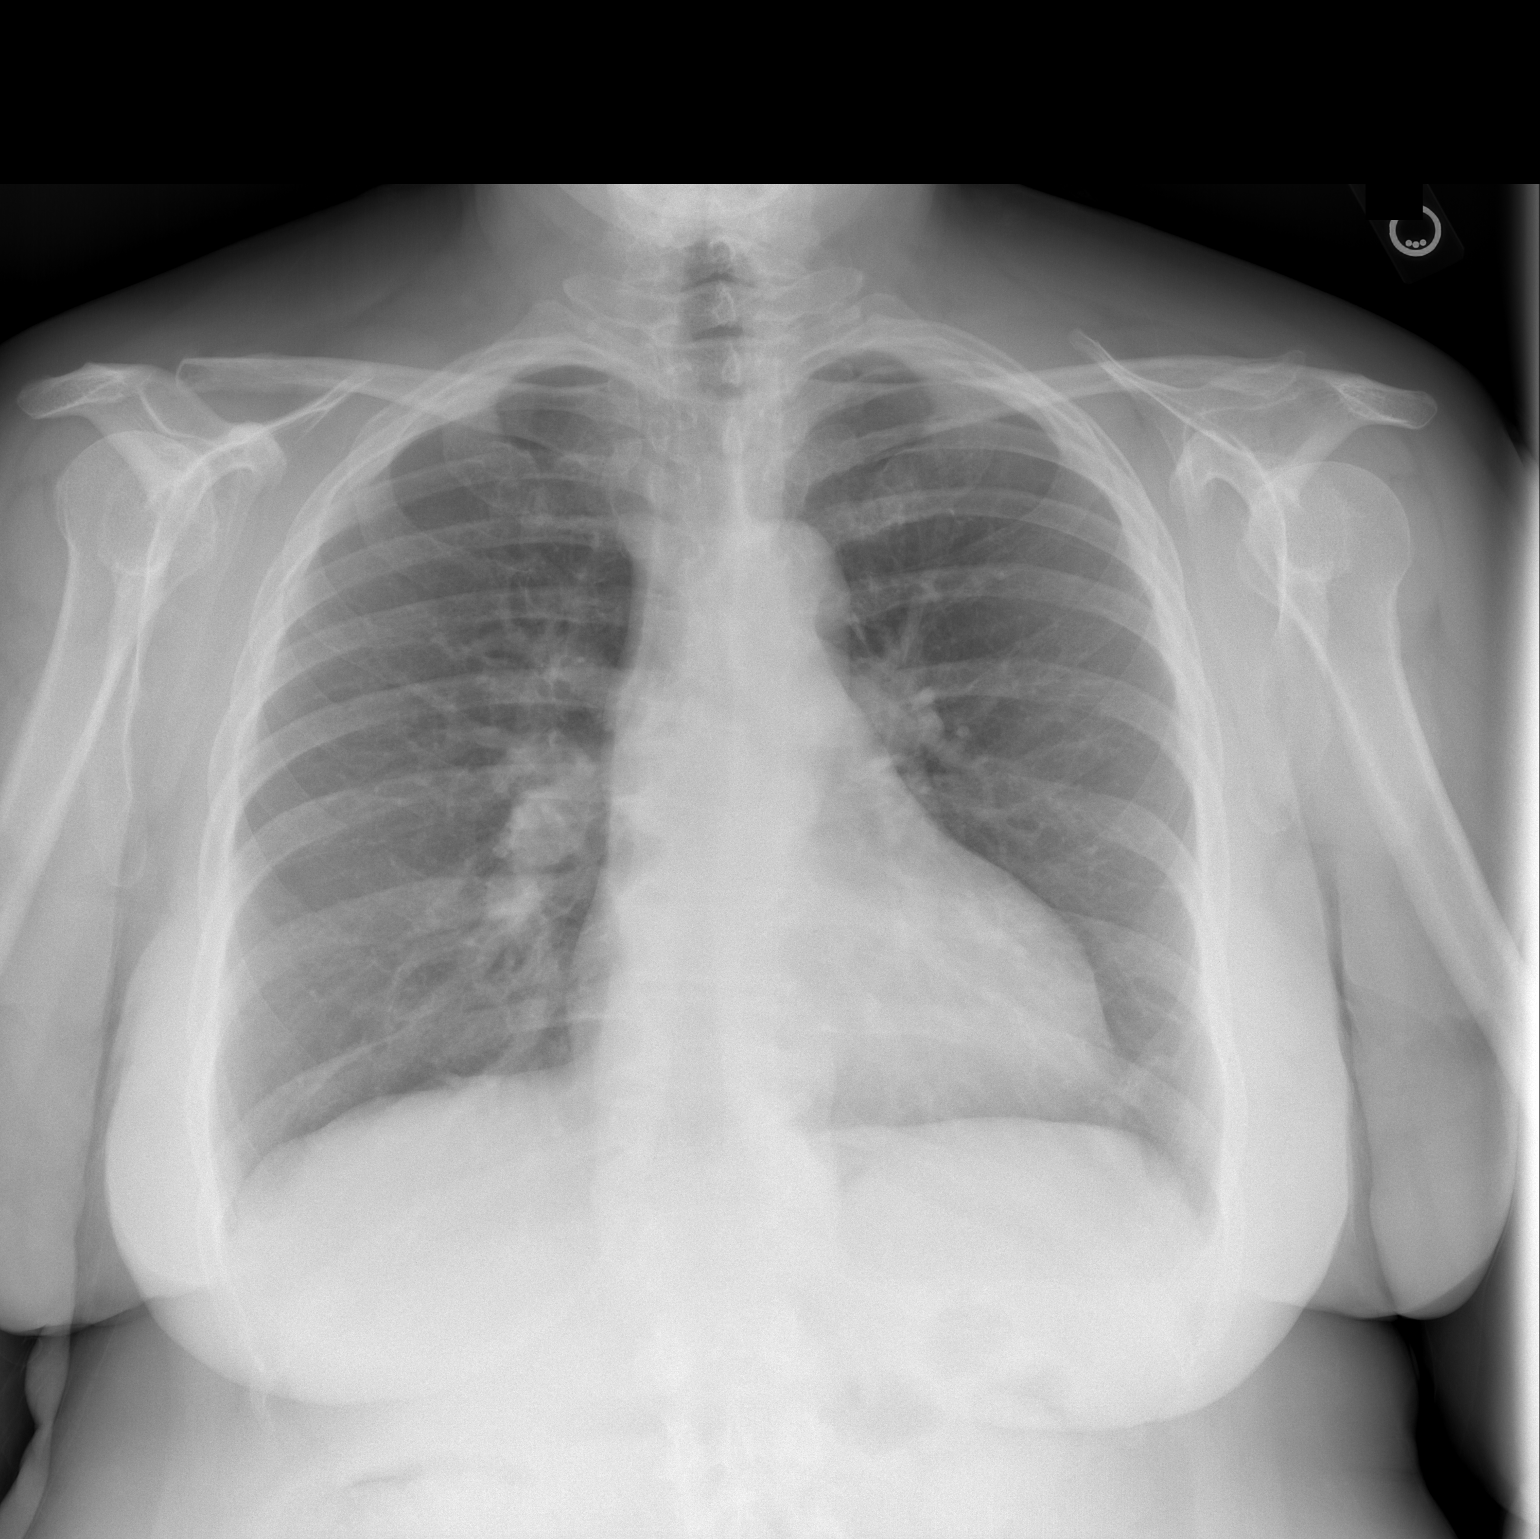

[w chest lat]
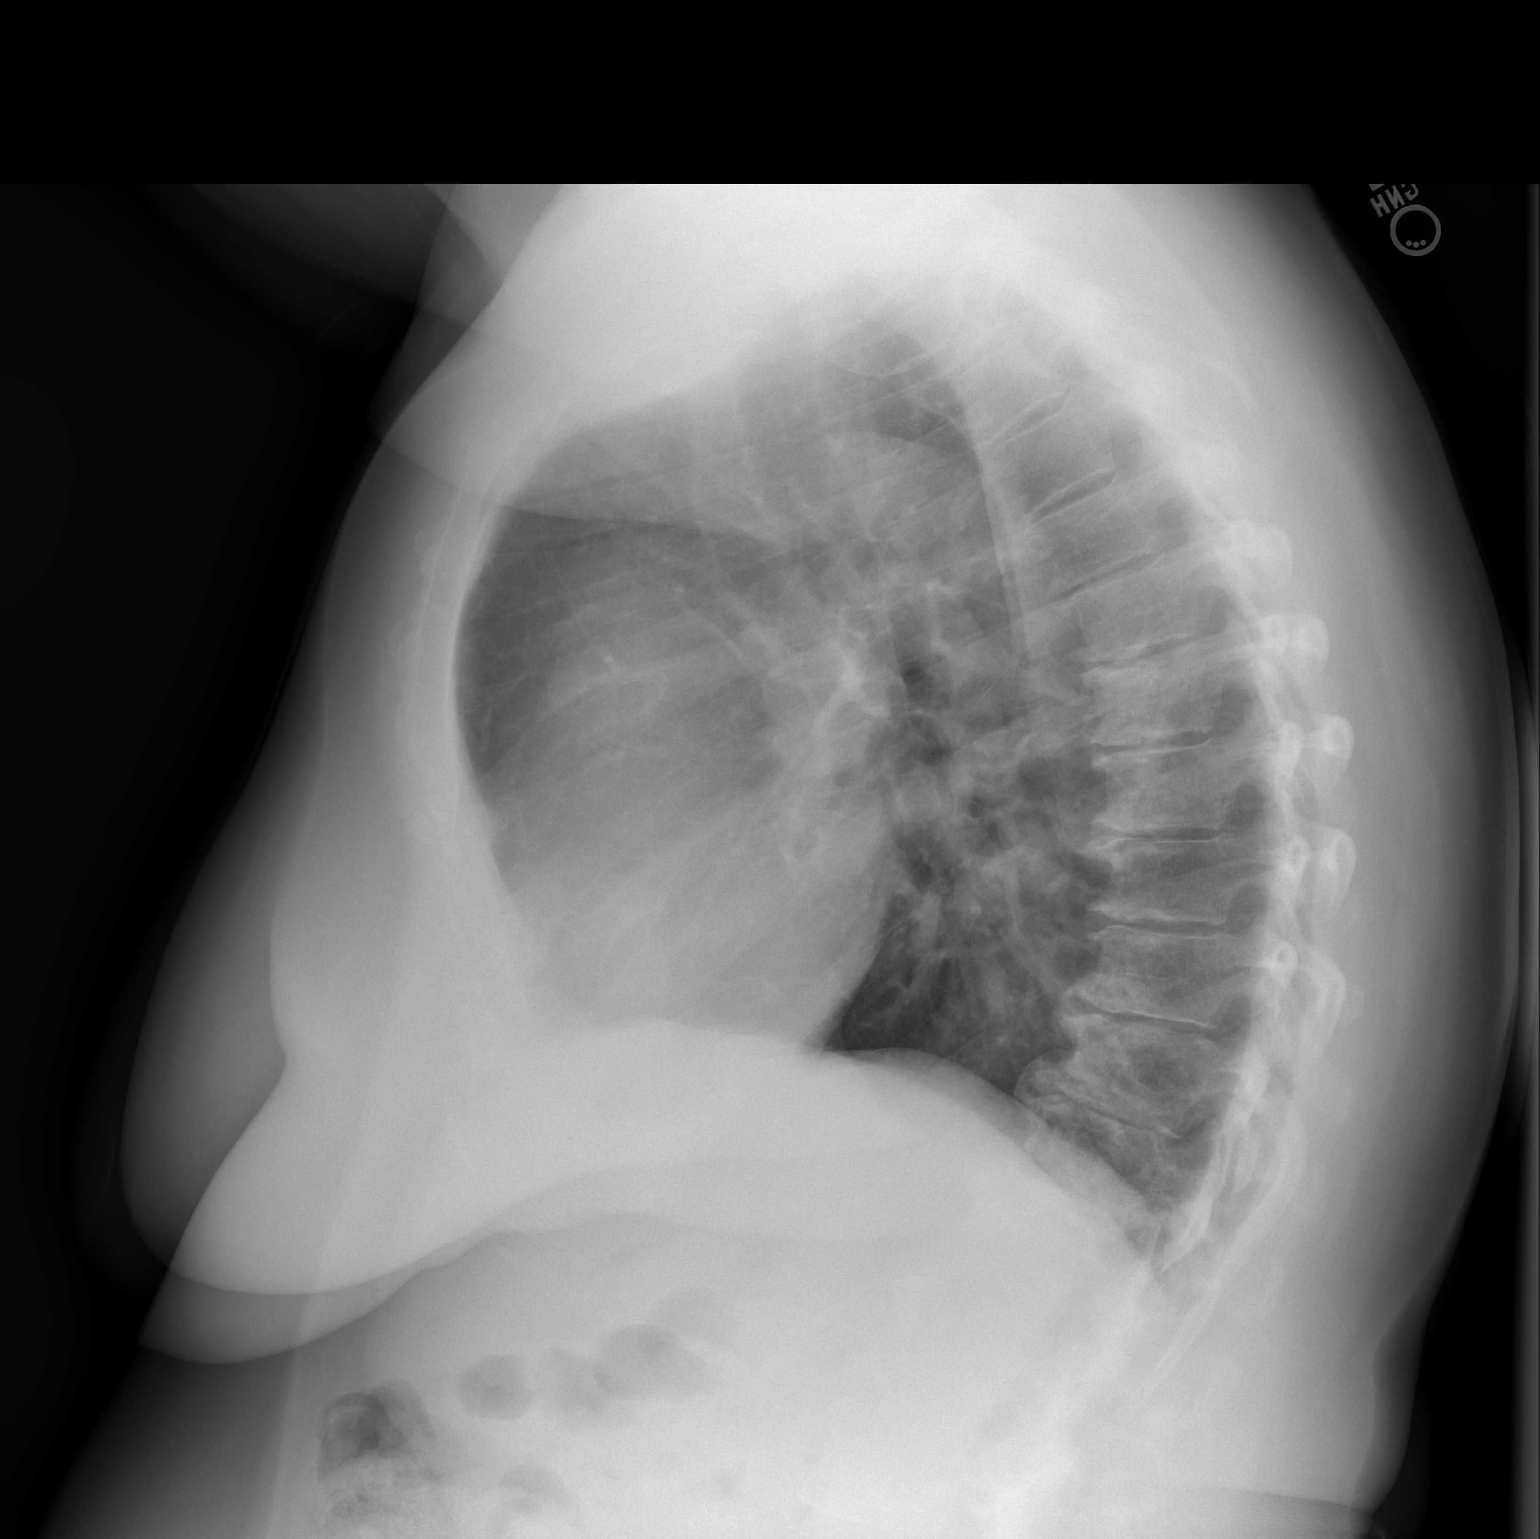

[2 of 2 positions shown; findings below may reference images not displayed]

FINDINGS: The heart size and mediastinal contours are within normal limits.
Both lungs are clear. The visualized skeletal structures are
unremarkable.
IMPRESSION: No active cardiopulmonary disease.

## 2021-02-13 DIAGNOSIS — R112 Nausea with vomiting, unspecified: Secondary | ICD-10-CM | POA: Diagnosis not present

## 2021-02-22 ENCOUNTER — Other Ambulatory Visit: Payer: Self-pay

## 2021-02-22 ENCOUNTER — Encounter (HOSPITAL_BASED_OUTPATIENT_CLINIC_OR_DEPARTMENT_OTHER): Payer: Self-pay

## 2021-02-22 ENCOUNTER — Emergency Department (HOSPITAL_BASED_OUTPATIENT_CLINIC_OR_DEPARTMENT_OTHER): Payer: Federal, State, Local not specified - PPO

## 2021-02-22 ENCOUNTER — Inpatient Hospital Stay (HOSPITAL_BASED_OUTPATIENT_CLINIC_OR_DEPARTMENT_OTHER)
Admission: EM | Admit: 2021-02-22 | Discharge: 2021-02-27 | DRG: 330 | Disposition: A | Discharge status: Home / Self Care | Payer: Federal, State, Local not specified - PPO | Attending: Internal Medicine | Admitting: Internal Medicine

## 2021-02-22 DIAGNOSIS — D751 Secondary polycythemia: Secondary | ICD-10-CM | POA: Diagnosis present

## 2021-02-22 DIAGNOSIS — Z79890 Hormone replacement therapy (postmenopausal): Secondary | ICD-10-CM

## 2021-02-22 DIAGNOSIS — Z87891 Personal history of nicotine dependence: Secondary | ICD-10-CM | POA: Diagnosis not present

## 2021-02-22 DIAGNOSIS — Z4659 Encounter for fitting and adjustment of other gastrointestinal appliance and device: Secondary | ICD-10-CM | POA: Diagnosis not present

## 2021-02-22 DIAGNOSIS — K5090 Crohn's disease, unspecified, without complications: Secondary | ICD-10-CM | POA: Diagnosis not present

## 2021-02-22 DIAGNOSIS — E785 Hyperlipidemia, unspecified: Secondary | ICD-10-CM

## 2021-02-22 DIAGNOSIS — I129 Hypertensive chronic kidney disease with stage 1 through stage 4 chronic kidney disease, or unspecified chronic kidney disease: Secondary | ICD-10-CM | POA: Diagnosis not present

## 2021-02-22 DIAGNOSIS — K5730 Diverticulosis of large intestine without perforation or abscess without bleeding: Secondary | ICD-10-CM | POA: Diagnosis not present

## 2021-02-22 DIAGNOSIS — Z20822 Contact with and (suspected) exposure to covid-19: Secondary | ICD-10-CM | POA: Diagnosis not present

## 2021-02-22 DIAGNOSIS — N1832 Chronic kidney disease, stage 3b: Secondary | ICD-10-CM | POA: Diagnosis present

## 2021-02-22 DIAGNOSIS — I10 Essential (primary) hypertension: Secondary | ICD-10-CM | POA: Diagnosis not present

## 2021-02-22 DIAGNOSIS — K219 Gastro-esophageal reflux disease without esophagitis: Secondary | ICD-10-CM | POA: Diagnosis present

## 2021-02-22 DIAGNOSIS — K56609 Unspecified intestinal obstruction, unspecified as to partial versus complete obstruction: Secondary | ICD-10-CM | POA: Diagnosis not present

## 2021-02-22 DIAGNOSIS — M1990 Unspecified osteoarthritis, unspecified site: Secondary | ICD-10-CM | POA: Diagnosis present

## 2021-02-22 DIAGNOSIS — R000 Tachycardia, unspecified: Secondary | ICD-10-CM | POA: Diagnosis present

## 2021-02-22 DIAGNOSIS — Z888 Allergy status to other drugs, medicaments and biological substances status: Secondary | ICD-10-CM

## 2021-02-22 DIAGNOSIS — E878 Other disorders of electrolyte and fluid balance, not elsewhere classified: Secondary | ICD-10-CM | POA: Diagnosis present

## 2021-02-22 DIAGNOSIS — K7689 Other specified diseases of liver: Secondary | ICD-10-CM | POA: Diagnosis not present

## 2021-02-22 DIAGNOSIS — R918 Other nonspecific abnormal finding of lung field: Secondary | ICD-10-CM | POA: Diagnosis present

## 2021-02-22 DIAGNOSIS — Z882 Allergy status to sulfonamides status: Secondary | ICD-10-CM

## 2021-02-22 DIAGNOSIS — N183 Chronic kidney disease, stage 3 unspecified: Secondary | ICD-10-CM

## 2021-02-22 DIAGNOSIS — E876 Hypokalemia: Secondary | ICD-10-CM | POA: Diagnosis not present

## 2021-02-22 DIAGNOSIS — Z7982 Long term (current) use of aspirin: Secondary | ICD-10-CM

## 2021-02-22 DIAGNOSIS — K563 Gallstone ileus: Secondary | ICD-10-CM | POA: Diagnosis not present

## 2021-02-22 DIAGNOSIS — Z79899 Other long term (current) drug therapy: Secondary | ICD-10-CM

## 2021-02-22 DIAGNOSIS — R112 Nausea with vomiting, unspecified: Secondary | ICD-10-CM | POA: Diagnosis present

## 2021-02-22 DIAGNOSIS — Z96611 Presence of right artificial shoulder joint: Secondary | ICD-10-CM | POA: Diagnosis present

## 2021-02-22 DIAGNOSIS — N179 Acute kidney failure, unspecified: Secondary | ICD-10-CM | POA: Diagnosis not present

## 2021-02-22 DIAGNOSIS — F32A Depression, unspecified: Secondary | ICD-10-CM | POA: Diagnosis present

## 2021-02-22 DIAGNOSIS — Z880 Allergy status to penicillin: Secondary | ICD-10-CM

## 2021-02-22 DIAGNOSIS — E869 Volume depletion, unspecified: Secondary | ICD-10-CM | POA: Diagnosis not present

## 2021-02-22 DIAGNOSIS — I700 Atherosclerosis of aorta: Secondary | ICD-10-CM | POA: Diagnosis not present

## 2021-02-22 DIAGNOSIS — Z91013 Allergy to seafood: Secondary | ICD-10-CM

## 2021-02-22 DIAGNOSIS — Z91040 Latex allergy status: Secondary | ICD-10-CM

## 2021-02-22 DIAGNOSIS — Z96642 Presence of left artificial hip joint: Secondary | ICD-10-CM | POA: Diagnosis present

## 2021-02-22 DIAGNOSIS — Z90710 Acquired absence of both cervix and uterus: Secondary | ICD-10-CM

## 2021-02-22 DIAGNOSIS — A084 Viral intestinal infection, unspecified: Secondary | ICD-10-CM | POA: Diagnosis present

## 2021-02-22 DIAGNOSIS — Z6836 Body mass index (BMI) 36.0-36.9, adult: Secondary | ICD-10-CM

## 2021-02-22 DIAGNOSIS — E669 Obesity, unspecified: Secondary | ICD-10-CM | POA: Diagnosis present

## 2021-02-22 LAB — CBC
HCT: 52.4 % — ABNORMAL HIGH (ref 36.0–46.0)
Hemoglobin: 16.9 g/dL — ABNORMAL HIGH (ref 12.0–15.0)
MCH: 29 pg (ref 26.0–34.0)
MCHC: 32.3 g/dL (ref 30.0–36.0)
MCV: 90 fL (ref 80.0–100.0)
Platelets: 299 10*3/uL (ref 150–400)
RBC: 5.82 MIL/uL — ABNORMAL HIGH (ref 3.87–5.11)
RDW: 13.9 % (ref 11.5–15.5)
WBC: 10.8 10*3/uL — ABNORMAL HIGH (ref 4.0–10.5)
nRBC: 0 % (ref 0.0–0.2)

## 2021-02-22 LAB — COMPREHENSIVE METABOLIC PANEL
ALT: 14 U/L (ref 0–44)
AST: 18 U/L (ref 15–41)
Albumin: 4.4 g/dL (ref 3.5–5.0)
Alkaline Phosphatase: 69 U/L (ref 38–126)
Anion gap: 15 (ref 5–15)
BUN: 20 mg/dL (ref 8–23)
CO2: 24 mmol/L (ref 22–32)
Calcium: 10.2 mg/dL (ref 8.9–10.3)
Chloride: 98 mmol/L (ref 98–111)
Creatinine, Ser: 1.67 mg/dL — ABNORMAL HIGH (ref 0.44–1.00)
GFR, Estimated: 33 mL/min — ABNORMAL LOW (ref >60)
Glucose, Bld: 150 mg/dL — ABNORMAL HIGH (ref 70–99)
Potassium: 3.8 mmol/L (ref 3.5–5.1)
Sodium: 137 mmol/L (ref 135–145)
Total Bilirubin: 0.8 mg/dL (ref 0.3–1.2)
Total Protein: 7.8 g/dL (ref 6.5–8.1)

## 2021-02-22 LAB — RESP PANEL BY RT-PCR (FLU A&B, COVID) ARPGX2
Influenza A by PCR: NEGATIVE
Influenza B by PCR: NEGATIVE
SARS Coronavirus 2 by RT PCR: NEGATIVE

## 2021-02-22 LAB — LIPASE, BLOOD: Lipase: <10 U/L — ABNORMAL LOW (ref 11–51)

## 2021-02-22 LAB — LACTIC ACID, PLASMA: Lactic Acid, Venous: 1.4 mmol/L (ref 0.5–1.9)

## 2021-02-22 MED ORDER — MORPHINE SULFATE (PF) 2 MG/ML IV SOLN
2.0000 mg | Freq: Once | INTRAVENOUS | Status: AC
  Administered 2021-02-22: 2 mg via INTRAVENOUS
  Filled 2021-02-22: qty 1

## 2021-02-22 MED ORDER — ONDANSETRON HCL 4 MG/2ML IJ SOLN
4.0000 mg | Freq: Once | INTRAMUSCULAR | Status: AC
  Administered 2021-02-22: 4 mg via INTRAVENOUS
  Filled 2021-02-22: qty 2

## 2021-02-22 MED ORDER — SODIUM CHLORIDE 0.9 % IV BOLUS
1000.0000 mL | Freq: Once | INTRAVENOUS | Status: AC
  Administered 2021-02-22: 1000 mL via INTRAVENOUS

## 2021-02-22 MED ORDER — LIDOCAINE HCL URETHRAL/MUCOSAL 2 % EX GEL
1.0000 "application " | Freq: Once | CUTANEOUS | Status: AC
  Administered 2021-02-22: 1 via TOPICAL
  Filled 2021-02-22: qty 11

## 2021-02-22 NOTE — ED Notes (Signed)
Patient transported to CT 

## 2021-02-22 NOTE — ED Notes (Signed)
Suction turned up to medium suction per Dr. Jeanell Sparrow until contents has slowed down

## 2021-02-22 NOTE — ED Triage Notes (Signed)
Pt presents POV with c/o generalized abdominal pain since Friday evening.  Has had some nausea but no vomiting.  Had once loose BM last evening but otherwise says she has been somewhat "stopped up".  Denies fevers. Describes an episode of right lower quadrant pain when lying on her right side, but says the pain is more generalized today.  Pt noted to be quite diaphoretic during triage.

## 2021-02-22 NOTE — ED Notes (Signed)
Right nare 14 french NG placed by TimSmith,RN. Immediate return of 400 cc brown drainage

## 2021-02-22 NOTE — ED Provider Notes (Signed)
[place] EMERGENCY DEPT Provider Note   CSN: [id number] Arrival date & time: 02/22/21  1548     History  Chief Complaint  Patient presents with   Abdominal Pain    Jacqueline Riggs is a 66 y.o. female with medical history significant for hysterectomy, CKD stage III, hypertension who presents to the ED due to abdominal pain since Friday.  Patient states that the pain is diffusely located throughout her abdomen and it comes and goes.  She describes the pain as a pressure that is sharp.  She states that she has attempted to alleviate her symptoms utilizing Phenergan which provides transient relief, symptoms always return.  Patient had a colonoscopy on 12/8 with Dr. Joanette Gula out of [facility] gastroenterology.  Patient endorses nausea, vomiting, diarrhea, shortness of breath.  Patient denies fevers, chest pain, blood in stool, blood in vomit.   Abdominal Pain Associated symptoms: diarrhea, nausea and vomiting   Associated symptoms: no chest pain, no chills, no fever and no shortness of breath       Home Medications Prior to Admission medications   Medication Sig Start Date End Date Taking? Authorizing Provider  amLODipine (NORVASC) 10 MG tablet Take 10 mg by mouth daily. 06/19/18  Yes [provider]  calcium carbonate (TUMS - DOSED IN MG ELEMENTAL CALCIUM) 500 MG chewable tablet Chew 3 tablets by mouth at bedtime.   Yes [provider]  cetirizine (ZYRTEC) 10 MG tablet Take 10 mg by mouth at bedtime.   Yes [provider]  Cholecalciferol (VITAMIN D) 50 MCG (2000 UT) tablet Take 4,000 Units by mouth daily.   Yes [provider]  desvenlafaxine (PRISTIQ) 100 MG 24 hr tablet Take 100 mg by mouth daily.   Yes [provider]  docusate sodium (COLACE) 100 MG capsule Take 1 capsule (100 mg total) by mouth 2 (two) times daily. 06/30/18  Yes Gary Fleet, PA-C  Estradiol 10 MCG TABS vaginal tablet Place 10 mcg vaginally 2 (two) times a week.  04/18/18  Yes [provider]  estrogens-methylTEST (ESTRATEST) 1.25-2.5 MG tablet Take 1 tablet by mouth daily. 05/11/18  Yes [provider]  famotidine (PEPCID) 20 MG tablet Take 20 mg by mouth 2 (two) times daily. 06/15/18  Yes [provider]  labetalol (NORMODYNE) 100 MG tablet Take 100 mg by mouth 2 (two) times daily. 04/17/18  Yes [provider]  lamoTRIgine (LAMICTAL) 150 MG tablet Take 150 mg by mouth at bedtime. 04/23/18  Yes [provider]  mometasone (NASONEX) 50 MCG/ACT nasal spray Place 2 sprays into the nose 2 (two) times a day.   Yes [provider]  rosuvastatin (CRESTOR) 10 MG tablet Take 10 mg by mouth at bedtime. 06/15/18  Yes [provider]  acetaminophen (TYLENOL) 500 MG tablet Take 500-1,000 mg by mouth every 6 (six) hours as needed for mild pain or headache.    [provider]  aspirin EC 325 MG tablet Take 1 tablet (325 mg total) by mouth 2 (two) times daily after a meal. Take x 1 month post op to decrease risk of blood clots. 06/30/18   Gary Fleet, PA-C  nefazodone (SERZONE) 250 MG tablet Take 250 mg by mouth 2 (two) times a day. 05/01/18   [provider]  oxyCODONE-acetaminophen (PERCOCET/ROXICET) 5-325 MG tablet Take 1-2 tablets by mouth every 6 (six) hours as needed for severe pain. 06/30/18   Gary Fleet, PA-C  Propylene Glycol (SYSTANE COMPLETE OP) Place 1 drop into both eyes at  bedtime.    [provider]  tiZANidine (ZANAFLEX) 2 MG tablet Take 1 tablet (2 mg total) by mouth every 8 (eight) hours as needed for muscle spasms. 06/30/18   Gary Fleet, PA-C  traMADol (ULTRAM) 50 MG tablet Take 50 mg by mouth every 8 (eight) hours as needed for moderate pain.  06/19/18   [provider]      Allergies    Penicillins, Dexilant [dexlansoprazole], Prilosec [omeprazole], Shellfish allergy, Ceclor [cefaclor], Hydrochlorothiazide, Latex, Relafen [nabumetone], and Sulfa antibiotics     Review of Systems   Review of Systems  Constitutional:  Negative for chills and fever.  Respiratory:  Negative for shortness of breath.   Cardiovascular:  Negative for chest pain.  Gastrointestinal:  Positive for abdominal pain, diarrhea, nausea and vomiting. Negative for blood in stool.  All other systems reviewed and are negative.  Physical Exam Updated Vital Signs BP (!) 153/101    Pulse 84    Temp 97.6 F (36.4 C)    Resp 20    Ht 5\' 2"  (1.575 m)    Wt 89.4 kg    SpO2 100%    BMI 36.03 kg/m  Physical Exam Vitals and nursing note reviewed.  Constitutional:      General: She is not in acute distress.    Appearance: She is not ill-appearing or toxic-appearing.  HENT:     Head: Normocephalic.     Mouth/Throat:     Mouth: Mucous membranes are moist.  Eyes:     Extraocular Movements: Extraocular movements intact.     Pupils: Pupils are equal, round, and reactive to light.  Cardiovascular:     Rate and Rhythm: Normal rate and regular rhythm.  Pulmonary:     Effort: Pulmonary effort is normal.     Breath sounds: Normal breath sounds. No wheezing.  Abdominal:     General: Bowel sounds are decreased. There is distension.     Palpations: Abdomen is soft.     Tenderness: There is generalized abdominal tenderness.  Musculoskeletal:     Cervical back: Normal range of motion. No rigidity or tenderness.  Skin:    General: Skin is warm and dry.     Capillary Refill: Capillary refill takes less than 2 seconds.  Neurological:     General: No focal deficit present.     Mental Status: She is alert and oriented to person, place, and time.  Psychiatric:        Mood and Affect: Mood normal.    ED Results / Procedures / Treatments   Labs (all labs ordered are listed, but only abnormal results are displayed) Labs Reviewed  CBC - Abnormal; Notable for the following components:      Result Value   WBC 10.8 (*)    RBC 5.82 (*)    Hemoglobin 16.9 (*)    HCT 52.4 (*)    All other  components within normal limits  COMPREHENSIVE METABOLIC PANEL - Abnormal; Notable for the following components:   Glucose, Bld 150 (*)    Creatinine, Ser 1.67 (*)    GFR, Estimated 33 (*)    All other components within normal limits  LIPASE, BLOOD - Abnormal; Notable for the following components:   Lipase <10 (*)    All other components within normal limits  RESP PANEL BY RT-PCR (FLU A&B, COVID) ARPGX2  URINALYSIS, ROUTINE W REFLEX MICROSCOPIC  LACTIC ACID, PLASMA  LACTIC ACID, PLASMA    EKG None  Radiology CT ABDOMEN PELVIS WO CONTRAST  Result Date: 02/22/2021 CLINICAL DATA:  Nonlocalized abdominal pain.  Nausea. EXAM: CT ABDOMEN AND PELVIS WITHOUT CONTRAST TECHNIQUE: Multidetector CT imaging of the abdomen and pelvis was performed following the standard protocol without IV contrast. COMPARISON:  No Peri dominantly imaging. FINDINGS: Lower chest: Clustered micro nodules in the dependent right lower lobe. No pleural effusion. Heart is normal in size. Hepatobiliary: 14 mm cyst in the right lobe of the liver. No suspicious liver lesion. The gallbladder is contracted. Question choleduodenal fistula, series 5, image 63. No calcified intraluminal gallstone. Mild common bile duct dilatation at 8 mm. No visualized pneumobilia. Pancreas: No ductal dilatation or inflammation. Spleen: Normal in size without focal abnormality. Adrenals/Urinary Tract: Normal adrenal glands. Mild left renal atrophy and renal parenchymal thinning. No hydronephrosis or renal calculi. 11 mm cyst arises from the lower right kidney. No evidence of solid renal lesion. Stomach/Bowel: Dilated fluid-filled stomach and small bowel. Transition point at 2.1 cm lamellated calcification (presumed gallstone) within the distal ileum, series 2, image 64. Loss of fat plane between the gallbladder and duodenum suspicious for choleduodenal fistula, series 5, image 63. Air within the non dependent duodenum and proximal small bowel is presumably  intraluminal rather than pneumatosis. There is no non dependent air in the bowel wall. The appendix is not definitively visualized. The ascending, transverse, proximal descending colon are completely decompressed. Small volume of formed stool in the sigmoid colon. Moderate sigmoid colonic diverticulosis without diverticulitis. Vascular/Lymphatic: Normal caliber abdominal aorta with mild atherosclerosis. There is no portal venous or mesenteric gas. Scattered small mesenteric lymph nodes, likely reactive. There are few prominent bilateral inguinal nodes. Reproductive: Hysterectomy.  No adnexal mass. Other: Small volume free fluid/ascites within the pelvis and right upper quadrant. There is no free air. Mild generalized mesenteric edema. Musculoskeletal: Chronic bilateral L5 pars interarticularis defects with grade 1 anterolisthesis of L5 on S1 and near complete L5-S1 disc space loss. Additional degenerative change throughout the lumbar spine left hip arthroplasty. IMPRESSION: 1. Findings consistent with gallstone ileus/small bowel obstruction. Dilated fluid-filled stomach and small bowel with transition point in the distal ileum where there is a 2.1 cm lamellated calcification (presumed gallstone) in the distal ileum. Loss of fat plane between the gallbladder and duodenum suspicious for choleduodenal fistula. 2. Air within the non dependent duodenum and proximal small bowel is presumably intraluminal rather than pneumatosis. No free air. 3. Small volume free fluid/ascites within the pelvis and right upper quadrant. 4. Clustered micro nodules in the dependent right lower lobe, likely infectious or inflammatory. 5. Sigmoid colonic diverticulosis without diverticulitis. 6. Chronic bilateral L5 pars interarticularis defects with grade 1 anterolisthesis of L5 on S1 and near complete L5-S1 disc space loss. Aortic Atherosclerosis (ICD10-I70.0). Electronically Signed   By: Keith Rake M.D.   On: 02/22/2021 17:50     Procedures Procedures    Medications Ordered in ED Medications  lidocaine (XYLOCAINE) 2 % jelly 1 application (has no administration in time range)  ondansetron (ZOFRAN) injection 4 mg (4 mg Intravenous Given 02/22/21 1719)  sodium chloride 0.9 % bolus 1,000 mL (1,000 mLs Intravenous New Bag/Given 02/22/21 1722)    ED Course/ Medical Decision Making/ A&P                           Medical Decision Making  66 year old female with extensive medical history presents due to abdominal pain since Friday.  Patient also reports that she had a bout of abdominal pain the Monday after Christmas however  this episode of abdominal pain resolved itself after being prescribed Phenergan by her PCP.  On examination, patient is diaphoretic complaining of generalized abdominal pain.  Patient states that her abdomen is distended and that she has not had a normal bowel movement in 1 week.  Patient states that she is urinating without issue currently.  Patient work-up includes: Lipase, CBC, CMP, CT abdomen pelvis without contrast due to underlying CKD.  Patient lipase result less than 10 Patient CBC result has elevated white blood cell count of 10.8, elevated RBCs 5.82, elevated hemoglobin 16.9 Patient CMP result elevated glucose 150, creatinine 1.67.  Patient has CKD stage III.  Patient CT abdomen pelvis shows findings consistent with gallstone ileus/small bowel obstruction.  The patient has dilated fluid-filled stomach and small bowel with transition point in the distal ileum where there is 2.1 cm calcification that is presumed to be a gallstone in the distal ileum causing obstruction.  Due to this finding, I have started the patient on fluid rehydration.  I have consulted with general surgery and awaiting their call back.   Dr. Barry Dienes, general surgery returned my call and advised me to insert NG tube in the patient.  Dr. Barry Dienes also advised me to consult hospitalist service to have the patient admitted.   Consult to hospitalist has been placed.  Patient has IV fluids already hanging at this time.  I discussed this patient and her case with Dr. Posey Pronto who has agreed to admit the patient through the hospitalist service. General surgery will consult on the patient.  I have updated the patient of the current plan and she is in agreement to be transferred to [facility] for further management of her small bowel obstruction.  The patient is stable at time of admission.   Final Clinical Impression(s) / ED Diagnoses Final diagnoses:  Small bowel obstruction [facility])    Rx / DC Orders ED Discharge Orders     None         Lawana Chambers 02/22/21 1917    Pattricia Boss, MD 02/22/21 2150

## 2021-02-22 NOTE — ED Notes (Signed)
Pt states she wants to wait to try and urinate for urinalysis.

## 2021-02-23 ENCOUNTER — Inpatient Hospital Stay (HOSPITAL_COMMUNITY): Payer: Federal, State, Local not specified - PPO | Admitting: Certified Registered"

## 2021-02-23 ENCOUNTER — Encounter (HOSPITAL_COMMUNITY): Payer: Self-pay | Admitting: Internal Medicine

## 2021-02-23 ENCOUNTER — Encounter (HOSPITAL_COMMUNITY)
Admission: EM | Disposition: A | Discharge status: Home / Self Care | Payer: Self-pay | Source: Home / Self Care | Attending: Internal Medicine

## 2021-02-23 DIAGNOSIS — I10 Essential (primary) hypertension: Secondary | ICD-10-CM

## 2021-02-23 DIAGNOSIS — M1990 Unspecified osteoarthritis, unspecified site: Secondary | ICD-10-CM | POA: Diagnosis not present

## 2021-02-23 DIAGNOSIS — N179 Acute kidney failure, unspecified: Secondary | ICD-10-CM | POA: Diagnosis not present

## 2021-02-23 DIAGNOSIS — Z6836 Body mass index (BMI) 36.0-36.9, adult: Secondary | ICD-10-CM | POA: Diagnosis not present

## 2021-02-23 DIAGNOSIS — K563 Gallstone ileus: Principal | ICD-10-CM

## 2021-02-23 DIAGNOSIS — D751 Secondary polycythemia: Secondary | ICD-10-CM | POA: Diagnosis present

## 2021-02-23 DIAGNOSIS — N183 Chronic kidney disease, stage 3 unspecified: Secondary | ICD-10-CM | POA: Diagnosis not present

## 2021-02-23 DIAGNOSIS — Z888 Allergy status to other drugs, medicaments and biological substances status: Secondary | ICD-10-CM | POA: Diagnosis not present

## 2021-02-23 DIAGNOSIS — E869 Volume depletion, unspecified: Secondary | ICD-10-CM | POA: Diagnosis present

## 2021-02-23 DIAGNOSIS — Z880 Allergy status to penicillin: Secondary | ICD-10-CM | POA: Diagnosis not present

## 2021-02-23 DIAGNOSIS — K56609 Unspecified intestinal obstruction, unspecified as to partial versus complete obstruction: Secondary | ICD-10-CM | POA: Diagnosis not present

## 2021-02-23 DIAGNOSIS — R000 Tachycardia, unspecified: Secondary | ICD-10-CM | POA: Diagnosis present

## 2021-02-23 DIAGNOSIS — E876 Hypokalemia: Secondary | ICD-10-CM | POA: Diagnosis present

## 2021-02-23 DIAGNOSIS — Z91040 Latex allergy status: Secondary | ICD-10-CM | POA: Diagnosis not present

## 2021-02-23 DIAGNOSIS — K5090 Crohn's disease, unspecified, without complications: Secondary | ICD-10-CM | POA: Diagnosis present

## 2021-02-23 DIAGNOSIS — F32A Depression, unspecified: Secondary | ICD-10-CM | POA: Diagnosis present

## 2021-02-23 DIAGNOSIS — K5669 Other partial intestinal obstruction: Secondary | ICD-10-CM | POA: Diagnosis not present

## 2021-02-23 DIAGNOSIS — E785 Hyperlipidemia, unspecified: Secondary | ICD-10-CM

## 2021-02-23 DIAGNOSIS — N1832 Chronic kidney disease, stage 3b: Secondary | ICD-10-CM | POA: Diagnosis present

## 2021-02-23 DIAGNOSIS — I129 Hypertensive chronic kidney disease with stage 1 through stage 4 chronic kidney disease, or unspecified chronic kidney disease: Secondary | ICD-10-CM | POA: Diagnosis present

## 2021-02-23 DIAGNOSIS — R918 Other nonspecific abnormal finding of lung field: Secondary | ICD-10-CM | POA: Diagnosis present

## 2021-02-23 DIAGNOSIS — Z20822 Contact with and (suspected) exposure to covid-19: Secondary | ICD-10-CM | POA: Diagnosis present

## 2021-02-23 DIAGNOSIS — Z96611 Presence of right artificial shoulder joint: Secondary | ICD-10-CM | POA: Diagnosis present

## 2021-02-23 DIAGNOSIS — Z90710 Acquired absence of both cervix and uterus: Secondary | ICD-10-CM | POA: Diagnosis not present

## 2021-02-23 DIAGNOSIS — Z96642 Presence of left artificial hip joint: Secondary | ICD-10-CM | POA: Diagnosis present

## 2021-02-23 DIAGNOSIS — Z882 Allergy status to sulfonamides status: Secondary | ICD-10-CM | POA: Diagnosis not present

## 2021-02-23 DIAGNOSIS — Z87891 Personal history of nicotine dependence: Secondary | ICD-10-CM | POA: Diagnosis not present

## 2021-02-23 HISTORY — PX: LAPAROTOMY: SHX154

## 2021-02-23 LAB — CBC
HCT: 48.8 % — ABNORMAL HIGH (ref 36.0–46.0)
Hemoglobin: 16.1 g/dL — ABNORMAL HIGH (ref 12.0–15.0)
MCH: 29.9 pg (ref 26.0–34.0)
MCHC: 33 g/dL (ref 30.0–36.0)
MCV: 90.5 fL (ref 80.0–100.0)
Platelets: 294 10*3/uL (ref 150–400)
RBC: 5.39 MIL/uL — ABNORMAL HIGH (ref 3.87–5.11)
RDW: 14 % (ref 11.5–15.5)
WBC: 7.4 10*3/uL (ref 4.0–10.5)
nRBC: 0 % (ref 0.0–0.2)

## 2021-02-23 LAB — BASIC METABOLIC PANEL
Anion gap: 12 (ref 5–15)
BUN: 31 mg/dL — ABNORMAL HIGH (ref 8–23)
CO2: 24 mmol/L (ref 22–32)
Calcium: 9.5 mg/dL (ref 8.9–10.3)
Chloride: 102 mmol/L (ref 98–111)
Creatinine, Ser: 1.74 mg/dL — ABNORMAL HIGH (ref 0.44–1.00)
GFR, Estimated: 32 mL/min — ABNORMAL LOW (ref >60)
Glucose, Bld: 109 mg/dL — ABNORMAL HIGH (ref 70–99)
Potassium: 3.6 mmol/L (ref 3.5–5.1)
Sodium: 138 mmol/L (ref 135–145)

## 2021-02-23 LAB — PROCALCITONIN: Procalcitonin: 0.1 ng/mL

## 2021-02-23 LAB — MAGNESIUM: Magnesium: 2.2 mg/dL (ref 1.7–2.4)

## 2021-02-23 LAB — HIV ANTIBODY (ROUTINE TESTING W REFLEX): HIV Screen 4th Generation wRfx: NONREACTIVE

## 2021-02-23 SURGERY — LAPAROTOMY, EXPLORATORY
Anesthesia: General | Site: Abdomen

## 2021-02-23 MED ORDER — 0.9 % SODIUM CHLORIDE (POUR BTL) OPTIME
TOPICAL | Status: DC | PRN
  Administered 2021-02-23: 2000 mL

## 2021-02-23 MED ORDER — FENTANYL CITRATE (PF) 250 MCG/5ML IJ SOLN
INTRAMUSCULAR | Status: DC | PRN
  Administered 2021-02-23: 50 ug via INTRAVENOUS
  Administered 2021-02-23: 100 ug via INTRAVENOUS
  Administered 2021-02-23 (×2): 50 ug via INTRAVENOUS

## 2021-02-23 MED ORDER — PROPOFOL 10 MG/ML IV BOLUS
INTRAVENOUS | Status: AC
  Filled 2021-02-23: qty 20

## 2021-02-23 MED ORDER — MORPHINE SULFATE (PF) 2 MG/ML IV SOLN
1.0000 mg | INTRAVENOUS | Status: DC | PRN
  Administered 2021-02-23: 1 mg via INTRAVENOUS
  Filled 2021-02-23 (×2): qty 1

## 2021-02-23 MED ORDER — STERILE WATER FOR IRRIGATION IR SOLN
Status: DC | PRN
  Administered 2021-02-23: 1000 mL

## 2021-02-23 MED ORDER — LIDOCAINE HCL (PF) 2 % IJ SOLN
INTRAMUSCULAR | Status: AC
  Filled 2021-02-23: qty 5

## 2021-02-23 MED ORDER — DEXAMETHASONE SODIUM PHOSPHATE 10 MG/ML IJ SOLN
INTRAMUSCULAR | Status: AC
  Filled 2021-02-23: qty 1

## 2021-02-23 MED ORDER — DEXAMETHASONE SODIUM PHOSPHATE 10 MG/ML IJ SOLN
INTRAMUSCULAR | Status: DC | PRN
Start: 2021-02-23 — End: 2021-02-23
  Administered 2021-02-23: 10 mg via INTRAVENOUS

## 2021-02-23 MED ORDER — AMISULPRIDE (ANTIEMETIC) 5 MG/2ML IV SOLN: 10.0000 mg | Freq: Once | INTRAVENOUS | Status: DC | PRN

## 2021-02-23 MED ORDER — LIDOCAINE 2% (20 MG/ML) 5 ML SYRINGE
INTRAMUSCULAR | Status: DC | PRN
  Administered 2021-02-23: 100 mg via INTRAVENOUS

## 2021-02-23 MED ORDER — SUGAMMADEX SODIUM 200 MG/2ML IV SOLN
INTRAVENOUS | Status: DC | PRN
  Administered 2021-02-23: 200 mg via INTRAVENOUS

## 2021-02-23 MED ORDER — SUCCINYLCHOLINE CHLORIDE 200 MG/10ML IV SOSY
PREFILLED_SYRINGE | INTRAVENOUS | Status: DC | PRN
  Administered 2021-02-23: 120 mg via INTRAVENOUS

## 2021-02-23 MED ORDER — HYDRALAZINE HCL 20 MG/ML IJ SOLN
10.0000 mg | Freq: Four times a day (QID) | INTRAMUSCULAR | Status: DC | PRN
  Administered 2021-02-23: 10 mg via INTRAVENOUS
  Filled 2021-02-23: qty 1

## 2021-02-23 MED ORDER — LACTATED RINGERS IV SOLN: INTRAVENOUS | Status: DC

## 2021-02-23 MED ORDER — METHOCARBAMOL 500 MG IVPB - SIMPLE MED
500.0000 mg | Freq: Three times a day (TID) | INTRAVENOUS | Status: DC | PRN
  Filled 2021-02-23: qty 50

## 2021-02-23 MED ORDER — PHENYLEPHRINE 40 MCG/ML (10ML) SYRINGE FOR IV PUSH (FOR BLOOD PRESSURE SUPPORT)
PREFILLED_SYRINGE | INTRAVENOUS | Status: DC | PRN
  Administered 2021-02-23 (×3): 80 ug via INTRAVENOUS

## 2021-02-23 MED ORDER — SUCCINYLCHOLINE CHLORIDE 200 MG/10ML IV SOSY
PREFILLED_SYRINGE | INTRAVENOUS | Status: AC
  Filled 2021-02-23: qty 10

## 2021-02-23 MED ORDER — ONDANSETRON HCL 4 MG/2ML IJ SOLN
INTRAMUSCULAR | Status: DC | PRN
  Administered 2021-02-23: 4 mg via INTRAVENOUS

## 2021-02-23 MED ORDER — FENTANYL CITRATE (PF) 250 MCG/5ML IJ SOLN
INTRAMUSCULAR | Status: AC
  Filled 2021-02-23: qty 5

## 2021-02-23 MED ORDER — MIDAZOLAM HCL 5 MG/5ML IJ SOLN
INTRAMUSCULAR | Status: DC | PRN
  Administered 2021-02-23: 2 mg via INTRAVENOUS

## 2021-02-23 MED ORDER — ROCURONIUM BROMIDE 10 MG/ML (PF) SYRINGE
PREFILLED_SYRINGE | INTRAVENOUS | Status: DC | PRN
  Administered 2021-02-23: 30 mg via INTRAVENOUS
  Administered 2021-02-23: 20 mg via INTRAVENOUS

## 2021-02-23 MED ORDER — LABETALOL HCL 5 MG/ML IV SOLN
5.0000 mg | INTRAVENOUS | Status: DC | PRN
  Administered 2021-02-23: 5 mg via INTRAVENOUS
  Filled 2021-02-23: qty 4

## 2021-02-23 MED ORDER — LABETALOL HCL 5 MG/ML IV SOLN: 10.0000 mg | INTRAVENOUS | Status: DC | PRN

## 2021-02-23 MED ORDER — MORPHINE SULFATE (PF) 4 MG/ML IV SOLN
4.0000 mg | INTRAVENOUS | Status: DC | PRN
Start: 2021-02-23 — End: 2021-02-27
  Administered 2021-02-23 – 2021-02-24 (×3): 4 mg via INTRAVENOUS
  Filled 2021-02-23 (×3): qty 1

## 2021-02-23 MED ORDER — ACETAMINOPHEN 10 MG/ML IV SOLN
INTRAVENOUS | Status: DC | PRN
  Administered 2021-02-23: 1000 mg via INTRAVENOUS

## 2021-02-23 MED ORDER — ACETAMINOPHEN 10 MG/ML IV SOLN
INTRAVENOUS | Status: AC
  Filled 2021-02-23: qty 100

## 2021-02-23 MED ORDER — SODIUM CHLORIDE 0.9 % IV SOLN: INTRAVENOUS | Status: AC

## 2021-02-23 MED ORDER — METRONIDAZOLE 500 MG/100ML IV SOLN
500.0000 mg | Freq: Once | INTRAVENOUS | Status: AC
  Administered 2021-02-23: 500 mg via INTRAVENOUS
  Filled 2021-02-23: qty 100

## 2021-02-23 MED ORDER — ONDANSETRON HCL 4 MG/2ML IJ SOLN: 4.0000 mg | Freq: Four times a day (QID) | INTRAMUSCULAR | Status: DC | PRN

## 2021-02-23 MED ORDER — CHLORHEXIDINE GLUCONATE CLOTH 2 % EX PADS
6.0000 | MEDICATED_PAD | Freq: Every day | CUTANEOUS | Status: DC
  Administered 2021-02-23 – 2021-02-26 (×3): 6 via TOPICAL

## 2021-02-23 MED ORDER — CIPROFLOXACIN IN D5W 400 MG/200ML IV SOLN
400.0000 mg | Freq: Once | INTRAVENOUS | Status: AC
  Administered 2021-02-23: 400 mg via INTRAVENOUS
  Filled 2021-02-23: qty 200

## 2021-02-23 MED ORDER — MIDAZOLAM HCL 2 MG/2ML IJ SOLN
INTRAMUSCULAR | Status: AC
  Filled 2021-02-23: qty 2

## 2021-02-23 MED ORDER — ACETAMINOPHEN 10 MG/ML IV SOLN
1000.0000 mg | Freq: Four times a day (QID) | INTRAVENOUS | Status: AC
  Administered 2021-02-23 – 2021-02-24 (×4): 1000 mg via INTRAVENOUS
  Filled 2021-02-23 (×4): qty 100

## 2021-02-23 MED ORDER — ONDANSETRON HCL 4 MG/2ML IJ SOLN
INTRAMUSCULAR | Status: AC
  Filled 2021-02-23: qty 2

## 2021-02-23 MED ORDER — ROCURONIUM BROMIDE 10 MG/ML (PF) SYRINGE
PREFILLED_SYRINGE | INTRAVENOUS | Status: AC
  Filled 2021-02-23: qty 10

## 2021-02-23 MED ORDER — HYDROMORPHONE HCL 1 MG/ML IJ SOLN: 0.2500 mg | INTRAMUSCULAR | Status: DC | PRN

## 2021-02-23 SURGICAL SUPPLY — 47 items
APL PRP STRL LF DISP 70% ISPRP (MISCELLANEOUS) ×1
BAG COUNTER SPONGE SURGICOUNT (BAG) IMPLANT
BAG SPNG CNTER NS LX DISP (BAG)
CHLORAPREP W/TINT 26 (MISCELLANEOUS) ×2 IMPLANT
COVER MAYO STAND STRL (DRAPES) ×2 IMPLANT
COVER SURGICAL LIGHT HANDLE (MISCELLANEOUS) ×2 IMPLANT
DRAPE LAPAROSCOPIC ABDOMINAL (DRAPES) ×2 IMPLANT
DRAPE UTILITY XL STRL (DRAPES) ×2 IMPLANT
DRAPE WARM FLUID 44X44 (DRAPES) ×2 IMPLANT
DRSG OPSITE POSTOP 4X10 (GAUZE/BANDAGES/DRESSINGS) IMPLANT
DRSG OPSITE POSTOP 4X6 (GAUZE/BANDAGES/DRESSINGS) IMPLANT
DRSG OPSITE POSTOP 4X8 (GAUZE/BANDAGES/DRESSINGS) ×1 IMPLANT
ELECT REM PT RETURN 15FT ADLT (MISCELLANEOUS) ×2 IMPLANT
GLOVE SURG POLYISO LF SZ7 (GLOVE) ×2 IMPLANT
GLOVE SURG UNDER POLY LF SZ7 (GLOVE) ×2 IMPLANT
GOWN STRL REUS W/TWL LRG LVL3 (GOWN DISPOSABLE) ×2 IMPLANT
GOWN STRL REUS W/TWL XL LVL3 (GOWN DISPOSABLE) ×2 IMPLANT
HANDLE SUCTION POOLE (INSTRUMENTS) ×1 IMPLANT
KIT BASIN OR (CUSTOM PROCEDURE TRAY) ×2 IMPLANT
KIT TURNOVER KIT A (KITS) IMPLANT
PACK GENERAL/GYN (CUSTOM PROCEDURE TRAY) ×2 IMPLANT
RELOAD PROXIMATE 100 BLUE (MISCELLANEOUS) IMPLANT
RELOAD PROXIMATE 100MM BLUE (MISCELLANEOUS)
RELOAD PROXIMATE 75MM BLUE (ENDOMECHANICALS) ×4 IMPLANT
RELOAD STAPLE 100 3.8 BLU REG (MISCELLANEOUS) IMPLANT
RELOAD STAPLE 75 3.8 BLU REG (ENDOMECHANICALS) IMPLANT
SPONGE T-LAP 18X18 ~~LOC~~+RFID (SPONGE) ×1 IMPLANT
STAPLER GUN LINEAR PROX 60 (STAPLE) ×1 IMPLANT
STAPLER PROXIMATE 100MM BLUE (MISCELLANEOUS) IMPLANT
STAPLER PROXIMATE 75MM BLUE (STAPLE) ×1 IMPLANT
STAPLER VISISTAT 35W (STAPLE) ×2 IMPLANT
SUCTION POOLE HANDLE (INSTRUMENTS) ×2
SUT MNCRL AB 4-0 PS2 18 (SUTURE) IMPLANT
SUT PDS AB 0 CT1 36 (SUTURE) ×2 IMPLANT
SUT SILK 2 0 (SUTURE) ×2
SUT SILK 2 0 SH CR/8 (SUTURE) ×2 IMPLANT
SUT SILK 2-0 18XBRD TIE 12 (SUTURE) ×1 IMPLANT
SUT SILK 3 0 (SUTURE) ×2
SUT SILK 3 0 SH CR/8 (SUTURE) ×2 IMPLANT
SUT SILK 3-0 18XBRD TIE 12 (SUTURE) ×1 IMPLANT
TOWEL OR 17X26 10 PK STRL BLUE (TOWEL DISPOSABLE) ×2 IMPLANT
TOWEL OR NON WOVEN STRL DISP B (DISPOSABLE) ×2 IMPLANT
TRAY FOL W/BAG SLVR 16FR STRL (SET/KITS/TRAYS/PACK) IMPLANT
TRAY FOLEY MTR SLVR 14FR STAT (SET/KITS/TRAYS/PACK) ×1 IMPLANT
TRAY FOLEY MTR SLVR 16FR STAT (SET/KITS/TRAYS/PACK) ×1 IMPLANT
TRAY FOLEY W/BAG SLVR 16FR LF (SET/KITS/TRAYS/PACK) ×2
YANKAUER SUCT BULB TIP NO VENT (SUCTIONS) ×2 IMPLANT

## 2021-02-23 NOTE — Anesthesia Preprocedure Evaluation (Addendum)
Anesthesia Evaluation  Patient identified by MRN, date of birth, ID band Patient awake    Reviewed: Allergy & Precautions, NPO status , Patient's Chart, lab work & pertinent test results, reviewed documented beta blocker date and time   History of Anesthesia Complications Negative for: history of anesthetic complications  Airway Mallampati: II  TM Distance: >3 FB Neck ROM: Full    Dental no notable dental hx.    Pulmonary sleep apnea , former smoker,    Pulmonary exam normal        Cardiovascular hypertension, Pt. on medications and Pt. on home beta blockers Normal cardiovascular exam     Neuro/Psych negative neurological ROS     GI/Hepatic Neg liver ROS, GERD  Medicated and Controlled,Gallstone ileus, NGT in place   Endo/Other  negative endocrine ROS  Renal/GU Renal Insufficiency and ARFRenal disease (Cr 1.74)  negative genitourinary   Musculoskeletal  (+) Arthritis ,   Abdominal   Peds  Hematology negative hematology ROS (+)   Anesthesia Other Findings Day of surgery medications reviewed with patient.  Reproductive/Obstetrics negative OB ROS                            Anesthesia Physical Anesthesia Plan  ASA: 3 and emergent  Anesthesia Plan: General   Post-op Pain Management: Ofirmev IV (intra-op)   Induction: Intravenous and Rapid sequence  PONV Risk Score and Plan: 4 or greater and Midazolam, Treatment may vary due to age or medical condition, Dexamethasone and Ondansetron  Airway Management Planned: Oral ETT and Video Laryngoscope Planned  Additional Equipment: None  Intra-op Plan:   Post-operative Plan: Extubation in OR  Informed Consent: I have reviewed the patients History and Physical, chart, labs and discussed the procedure including the risks, benefits and alternatives for the proposed anesthesia with the patient or authorized representative who has indicated  his/her understanding and acceptance.     Dental advisory given  Plan Discussed with: CRNA  Anesthesia Plan Comments:        Anesthesia Quick Evaluation

## 2021-02-23 NOTE — Progress Notes (Signed)
Pt arrrived to [facility] 0230 via [facility] B/P has decreased from last V/S at [facility] to arrival. Pt state pain level 0 on a scale of 1-10. [place],V MD has been assigned.

## 2021-02-23 NOTE — Anesthesia Procedure Notes (Signed)
Procedure Name: Intubation Date/Time: 02/23/2021 10:39 AM Performed by: Theophilus Walz D, CRNA Pre-anesthesia Checklist: Patient identified, Emergency Drugs available, Suction available and Patient being monitored Patient Re-evaluated:Patient Re-evaluated prior to induction Oxygen Delivery Method: Circle system utilized Preoxygenation: Pre-oxygenation with 100% oxygen Induction Type: IV induction and Rapid sequence Laryngoscope Size: Glidescope and 3 (planned glidescope due to existing NG) Grade View: Grade I Tube type: Oral Tube size: 7.0 mm Number of attempts: 1 Airway Equipment and Method: Stylet and Oral airway Placement Confirmation: ETT inserted through vocal cords under direct vision, positive ETCO2 and breath sounds checked- equal and bilateral Secured at: 21 cm Tube secured with: Tape Dental Injury: Teeth and Oropharynx as per pre-operative assessment

## 2021-02-23 NOTE — Anesthesia Postprocedure Evaluation (Signed)
Anesthesia Post Note  Patient: RHEDA KASSAB  Procedure(s) Performed: EXPLORATORY LAPAROTOMY, small bowel resection of anastomosis (Abdomen)     Patient location during evaluation: PACU Anesthesia Type: General Level of consciousness: awake and alert and oriented Pain management: pain level controlled Vital Signs Assessment: post-procedure vital signs reviewed and stable Respiratory status: spontaneous breathing, nonlabored ventilation and respiratory function stable Cardiovascular status: blood pressure returned to baseline Postop Assessment: no apparent nausea or vomiting Anesthetic complications: no   No notable events documented.  Last Vitals:  Vitals:   02/23/21 1245 02/23/21 1259  BP: (!) 166/95 (!) 189/96  Pulse: 88 91  Resp: 16 16  Temp: 36.9 C 36.6 C  SpO2: 97% 96%           Shanda Howells

## 2021-02-23 NOTE — Progress Notes (Signed)
But no PROGRESS NOTE  AYAK OLLER PYY:[id number] DOB: 01-20-1956 DOA: 02/22/2021 PCP: Henrine Screws, MD   LOS: 0 days   Brief narrative: Jacqueline Riggs is a 66 y.o. female with past medical history of hypertension, hyperlipidemia, CKD stage III, GERD, obstructive sleep apnea, depression presented to hospital with abdominal pain and distention.  In the ED patient was noted to be tachycardic and febrile with elevated blood pressure.  Labs showed WBC at 13.8 with creatinine of 1.6 with baseline 1.4 in May 2020.  CT scan of the abdomen and pelvis showed gallstone ileus and small bowel obstruction.  General surgery was consulted from the ED NG tube was placed and patient was admitted hospital.  Of note patient had colonoscopy on December 8 with a previous history of total abdominal hysterectomy.  Patient was then admitted to the hospital for further evaluation and treatment.  Assessment/Plan:  Principal Problem:   Gallstone ileus (HCC) Active Problems:   SBO (small bowel obstruction) (HCC)   Essential hypertension   Hyperlipidemia   CKD (chronic kidney disease), stage III (HCC)  Gallstone ileus with small bowel obstruction. Currently n.p.o. IV fluids and NG tube in place.  General surgery planning for surgical intervention today.   Pulmonary micronodules CT scan showing clustered micronodules.  History of smoking 10 years ago.  Procalcitonin 0.1.  Might benefit from dedicated chest CT for further evaluation after resolution of gallstone ileus/SBO.   Mild polycythemia Likely secondary to hemoconcentration.  We will continue to monitor.   Hypertension Continue IV labetalol prn SBP >170 since the patient is NPO.   CKD stage IIIb Creatinine 1.7 today.  Baseline creatinine of around 1.4 in May 2020.  Continue to monitor renal function.  Continue with IV fluid hydration.  DVT prophylaxis: SCDs Start: 02/23/21 3567   Code Status: Full code  Family Communication: Spoke with the patient's  husband at bedside.  Status is: Inpatient  Remains inpatient appropriate because: Need for surgical intervention, IV fluids, bowel obstruction.   Consultants: General surgery  Procedures: NG tube placement  Anti-infectives:  Cipro and Metro  Anti-infectives (From admission, onward)    Start     Dose/Rate Route Frequency Ordered Stop   02/23/21 0845  [MAR Hold]  ciprofloxacin (CIPRO) IVPB 400 mg        (MAR Hold since Mon 02/23/2021 at 0935.Hold Reason: Transfer to a Procedural area)   400 mg 200 mL/hr over 60 Minutes Intravenous  Once 02/23/21 0745 02/23/21 1041   02/23/21 0845  [MAR Hold]  metroNIDAZOLE (FLAGYL) IVPB 500 mg        (MAR Hold since Mon 02/23/2021 at 0935.Hold Reason: Transfer to a Procedural area)   500 mg 100 mL/hr over 60 Minutes Intravenous  Once 02/23/21 0745 02/23/21 1124      Subjective: Today, patient was seen and examined at bedside.  Denies any nausea vomiting after placement of NG tube.  Has mild abdominal distention.  Awaiting for surgical intervention.  Objective: Vitals:   02/23/21 0939 02/23/21 1200  BP: (!) 163/97 (!) 189/99  Pulse: 95 82  Resp: 18 15  Temp: 97.7 F (36.5 C) 98.7 F (37.1 C)  SpO2: 93% 99%    Intake/Output Summary (Last 24 hours) at 02/23/2021 1210 Last data filed at 02/23/2021 1200 Gross per 24 hour  Intake 2200 ml  Output 1400 ml  Net 800 ml   Filed Weights   02/22/21 1558  Weight: 89.4 kg   Body mass index is 36.03 kg/m.  Physical Exam:  GENERAL: Patient is alert awake and oriented. Not in obvious distress.  Obese. HENT: No scleral pallor or icterus. Pupils equally reactive to light. Oral mucosa is dry, NG tube in place. NECK: is supple, no gross swelling noted. CHEST: Clear to auscultation. No crackles or wheezes.  Diminished breath sounds bilaterally. CVS: S1 and S2 heard, no murmur. Regular rate and rhythm.  ABDOMEN: Soft, stented abdomen with nonspecific tenderness,  EXTREMITIES: No edema. CNS: Cranial  nerves are intact. No focal motor deficits. SKIN: warm and dry without rashes.  Data Review: I have personally reviewed the following laboratory data and studies,  CBC: Recent Labs  Lab 02/22/21 1607 02/23/21 0715  WBC 10.8* 7.4  HGB 16.9* 16.1*  HCT 52.4* 48.8*  MCV 90.0 90.5  PLT 299 [id number]   Basic Metabolic Panel: Recent Labs  Lab 02/22/21 1607 02/23/21 0715  NA 137 138  K 3.8 3.6  CL 98 102  CO2 24 24  GLUCOSE 150* 109*  BUN 20 31*  CREATININE 1.67* 1.74*  CALCIUM 10.2 9.5  MG  --  2.2   Liver Function Tests: Recent Labs  Lab 02/22/21 1607  AST 18  ALT 14  ALKPHOS 69  BILITOT 0.8  PROT 7.8  ALBUMIN 4.4   Recent Labs  Lab 02/22/21 1607  LIPASE <10*   No results for input(s): AMMONIA in the last 168 hours. Cardiac Enzymes: No results for input(s): CKTOTAL, CKMB, CKMBINDEX, TROPONINI in the last 168 hours. BNP (last 3 results) No results for input(s): BNP in the last 8760 hours.  ProBNP (last 3 results) No results for input(s): PROBNP in the last 8760 hours.  CBG: No results for input(s): GLUCAP in the last 168 hours. Recent Results (from the past 240 hour(s))  Resp Panel by RT-PCR (Flu A&B, Covid) Nasopharyngeal Swab     Status: None   Collection Time: 02/22/21  4:01 PM   Specimen: Nasopharyngeal Swab; Nasopharyngeal(NP) swabs in vial transport medium  Result Value Ref Range Status   SARS Coronavirus 2 by RT PCR NEGATIVE NEGATIVE Final    Comment: (NOTE) SARS-CoV-2 target nucleic acids are NOT DETECTED.  The SARS-CoV-2 RNA is generally detectable in upper respiratory specimens during the acute phase of infection. The lowest concentration of SARS-CoV-2 viral copies this assay can detect is 138 copies/mL. A negative result does not preclude SARS-Cov-2 infection and should not be used as the sole basis for treatment or other patient management decisions. A negative result may occur with  improper specimen collection/handling, submission of specimen  other than nasopharyngeal swab, presence of viral mutation(s) within the areas targeted by this assay, and inadequate number of viral copies(<138 copies/mL). A negative result must be combined with clinical observations, patient history, and epidemiological information. The expected result is Negative.  Fact Sheet for Patients:  EntrepreneurPulse.com.au  Fact Sheet for Healthcare Providers:  IncredibleEmployment.be  This test is no t yet approved or cleared by the [place] FDA and  has been authorized for detection and/or diagnosis of SARS-CoV-2 by FDA under an Emergency Use Authorization (EUA). This EUA will remain  in effect (meaning this test can be used) for the duration of the COVID-19 declaration under Section 564(b)(1) of the Act, 21 U.S.C.section 360bbb-3(b)(1), unless the authorization is terminated  or revoked sooner.       Influenza A by PCR NEGATIVE NEGATIVE Final   Influenza B by PCR NEGATIVE NEGATIVE Final    Comment: (NOTE) The Xpert Xpress SARS-CoV-2/FLU/RSV plus assay is intended as  an aid in the diagnosis of influenza from Nasopharyngeal swab specimens and should not be used as a sole basis for treatment. Nasal washings and aspirates are unacceptable for Xpert Xpress SARS-CoV-2/FLU/RSV testing.  Fact Sheet for Patients: EntrepreneurPulse.com.au  Fact Sheet for Healthcare Providers: IncredibleEmployment.be  This test is not yet approved or cleared by the [place] FDA and has been authorized for detection and/or diagnosis of SARS-CoV-2 by FDA under an Emergency Use Authorization (EUA). This EUA will remain in effect (meaning this test can be used) for the duration of the COVID-19 declaration under Section 564(b)(1) of the Act, 21 U.S.C. section 360bbb-3(b)(1), unless the authorization is terminated or revoked.  Performed at [facility], [address] [place], [place] [address]      Studies: CT ABDOMEN PELVIS WO CONTRAST  Result Date: 02/22/2021 CLINICAL DATA:  Nonlocalized abdominal pain.  Nausea. EXAM: CT ABDOMEN AND PELVIS WITHOUT CONTRAST TECHNIQUE: Multidetector CT imaging of the abdomen and pelvis was performed following the standard protocol without IV contrast. COMPARISON:  No Peri dominantly imaging. FINDINGS: Lower chest: Clustered micro nodules in the dependent right lower lobe. No pleural effusion. Heart is normal in size. Hepatobiliary: 14 mm cyst in the right lobe of the liver. No suspicious liver lesion. The gallbladder is contracted. Question choleduodenal fistula, series 5, image 63. No calcified intraluminal gallstone. Mild common bile duct dilatation at 8 mm. No visualized pneumobilia. Pancreas: No ductal dilatation or inflammation. Spleen: Normal in size without focal abnormality. Adrenals/Urinary Tract: Normal adrenal glands. Mild left renal atrophy and renal parenchymal thinning. No hydronephrosis or renal calculi. 11 mm cyst arises from the lower right kidney. No evidence of solid renal lesion. Stomach/Bowel: Dilated fluid-filled stomach and small bowel. Transition point at 2.1 cm lamellated calcification (presumed gallstone) within the distal ileum, series 2, image 64. Loss of fat plane between the gallbladder and duodenum suspicious for choleduodenal fistula, series 5, image 63. Air within the non dependent duodenum and proximal small bowel is presumably intraluminal rather than pneumatosis. There is no non dependent air in the bowel wall. The appendix is not definitively visualized. The ascending, transverse, proximal descending colon are completely decompressed. Small volume of formed stool in the sigmoid colon. Moderate sigmoid colonic diverticulosis without diverticulitis. Vascular/Lymphatic: Normal caliber abdominal aorta with mild atherosclerosis. There is no portal venous or mesenteric gas. Scattered small mesenteric  lymph nodes, likely reactive. There are few prominent bilateral inguinal nodes. Reproductive: Hysterectomy.  No adnexal mass. Other: Small volume free fluid/ascites within the pelvis and right upper quadrant. There is no free air. Mild generalized mesenteric edema. Musculoskeletal: Chronic bilateral L5 pars interarticularis defects with grade 1 anterolisthesis of L5 on S1 and near complete L5-S1 disc space loss. Additional degenerative change throughout the lumbar spine left hip arthroplasty. IMPRESSION: 1. Findings consistent with gallstone ileus/small bowel obstruction. Dilated fluid-filled stomach and small bowel with transition point in the distal ileum where there is a 2.1 cm lamellated calcification (presumed gallstone) in the distal ileum. Loss of fat plane between the gallbladder and duodenum suspicious for choleduodenal fistula. 2. Air within the non dependent duodenum and proximal small bowel is presumably intraluminal rather than pneumatosis. No free air. 3. Small volume free fluid/ascites within the pelvis and right upper quadrant. 4. Clustered micro nodules in the dependent right lower lobe, likely infectious or inflammatory. 5. Sigmoid colonic diverticulosis without diverticulitis. 6. Chronic bilateral L5 pars interarticularis defects with grade 1 anterolisthesis of L5 on S1 and near complete L5-S1 disc space loss. Aortic  Atherosclerosis (ICD10-I70.0). Electronically Signed   By: Keith Rake M.D.   On: 02/22/2021 17:50   DG Abdomen 1 View  Result Date: 02/22/2021 CLINICAL DATA:  NG tube placement EXAM: ABDOMEN - 1 VIEW COMPARISON:  CT 02/22/2021 FINDINGS: Esophageal tube tip overlies the stomach, side-port in the region of GE junction. Multiple air distended small bowel loops consistent with a bowel obstruction IMPRESSION: Esophageal tube side-port in the region of GE junction, consider further advancement for more optimal positioning. Multiple dilated loops of small bowel consistent with a  bowel obstruction. Electronically Signed   By: Donavan Foil M.D.   On: 02/22/2021 19:41      Flora Lipps, MD  [facility] 02/23/2021  If 7PM-7AM, please contact night-coverage

## 2021-02-23 NOTE — ED Notes (Signed)
Report given to Ben with [facility] 

## 2021-02-23 NOTE — Op Note (Signed)
Preoperative diagnosis: gallstone ileus  Postoperative diagnosis: same   Procedure: exploratory laparotomy, small bowel resection with anastomosis  Surgeon: Gurney Maxin, M.D.  Asst: Barkley Boards, [facility]  Anesthesia: general  Indications for procedure: Jacqueline Riggs is a 66 y.o. year old female with symptoms of abdominal pain with nausea and vomiting. On work up she was found to have a small bowel obstruction with concern for gallstone at the transition point.  Description of procedure: The patient was brought into the operative suite. Anesthesia was administered with General endotracheal anesthesia. WHO checklist was applied. The patient was then placed in supine position. The area was prepped and draped in the usual sterile fashion.  A lower midline incision was made cautery was used to dissect down through subcutaneous tissues and the fascia was incised.  Mall intestine was dilated and it was run distally there was a clear transition point in the distal ileum.  There appeared to be some creeping fat in the area.  Attempted to palpate for a large gallstone and did not palpate any such thing.  Is able to push fluid into the ileum and then into the cecum without issue.  Due to the abnormality of creeping fat along the distal ileum decision was made to perform small bowel resection with anastomosis.  This was performed with 275 mm GIA's.  The mesentery was taken down between Callery clamps and 3-0 silk tie were used for hemostasis.  An assistant was performed with a 75 mm GIA and enterotomy was closed with a 60 Miller TXA.  Antitension stitch was placed with 3-0 silk.  Staple edges were imbricated with 3-0 silk Lembert sutures.  The abdomen was irrigated with saline.  Everything appeared hemostatic.  Fascia was closed with 0 PDS in running fashion.  Skin was closed with staples.  Patient will from anesthesia was brought to PACU in stable condition.  Findings: creeping fat over the distal ileum, small  gallstone like material  Specimen: small bowel resection  Implant: none   Blood loss: 50 ml  Local anesthesia: none  Complications: none  Gurney Maxin, M.D. General, Bariatric, & Minimally Invasive Surgery [facility]

## 2021-02-23 NOTE — Consult Note (Signed)
Consult Note  Jacqueline Riggs 1955-03-31  [id number].    Requesting MD: Dr. Marlowe Sax Chief Complaint/Reason for Consult: gallstone ileus  HPI:  66 year old female with medical history significant for hypertension, hyperlipidemia, CKD stage III, GERD, OSA, depression who presented to ED with abdominal pain and distension. Symptoms had been present for 3-4 days. She has had nausea but no emesis. Last BM 2 days ago. Around Christmas she states she had gastrointestinal illness, symptoms resolved but she had continued a gentle diet.    Work up in ED significant for WBC 10.8, Cr 1.6, lipase and lactic acid normal, CT scan showing gallstone ileus/SBO. NGT placed and patient transferred to [facility] and admitted to hospitalist service.  Currently she still feels bloated but no nausea with NGT. ROS as below  Allergies: latex, cefaclor, PCN, sulfa abx,  Blood thinners: none Past Surgeries: total abdominal hysterectomy   ROS: Review of Systems  Constitutional:  Negative for chills and fever.  Respiratory:  Negative for cough, shortness of breath and wheezing.   Cardiovascular:  Negative for chest pain, palpitations and leg swelling.  Gastrointestinal:  Positive for abdominal pain, constipation and nausea. Negative for diarrhea and vomiting.  Genitourinary: Negative.    History reviewed. No pertinent family history.  Past Medical History:  Diagnosis Date   Arthritis    Chronic kidney disease    Stage 3   Depression    Hypertension    Sleep apnea     Past Surgical History:  Procedure Laterality Date   DG THUMB LEFT HAND Left    replacement   JOINT REPLACEMENT Left 05/24/1998   JOINT REPLACEMENT Right 02/23/2005   KNEE ARTHROSCOPY Left 10/07/1992   08/03/07   KNEE ARTHROSCOPY Right 12/18/2002   Left finger trigger release Left 09/22/2005   LIPOMA EXCISION Right    Flank   MOUTH SURGERY  06/25/2002   TOTAL HIP ARTHROPLASTY Left 06/30/2018   Procedure: TOTAL HIP ARTHROPLASTY  ANTERIOR APPROACH;  Surgeon: Dorna Leitz, MD;  Location: [facility];  Service: Orthopedics;  Laterality: Left;   TOTAL SHOULDER ARTHROPLASTY Right 01/04/2006   WISDOM TOOTH EXTRACTION  01/1971    Social History:  reports that she quit smoking about 28 years ago. She has a 30.00 pack-year smoking history. She has never used smokeless tobacco. She reports current alcohol use of about 1.0 standard drink per week. She reports that she does not use drugs.  Allergies:  Allergies  Allergen Reactions   Penicillins Rash    Did it involve swelling of the face/tongue/throat, SOB, or low BP? Yes Did it involve sudden or severe rash/hives, skin peeling, or any reaction on the inside of your mouth or nose? No Did you need to seek medical attention at a hospital or doctor's office? Yes When did it last happen? Late 1980s If all above answers are "NO", may proceed with cephalosporin use.    Dexilant [Dexlansoprazole] Other (See Comments)    Stomach pain and cramps   Prilosec [Omeprazole] Other (See Comments)    Stomach pain and cramps   Shellfish Allergy Diarrhea    Oysters and mussles   Ceclor [Cefaclor] Rash   Hydrochlorothiazide Rash   Latex Rash    Contact dermatitis   Relafen [Nabumetone] Rash    Also, no NSAIDs due to kidney function   Sulfa Antibiotics Rash    Medications Prior to Admission  Medication Sig Dispense Refill   acetaminophen (TYLENOL) 500 MG tablet Take 500-1,000 mg by mouth  every 6 (six) hours as needed for mild pain or headache.     amLODipine (NORVASC) 10 MG tablet Take 10 mg by mouth daily.     calcium carbonate (TUMS - DOSED IN MG ELEMENTAL CALCIUM) 500 MG chewable tablet Chew 3 tablets by mouth at bedtime.     cetirizine (ZYRTEC) 10 MG tablet Take 10 mg by mouth at bedtime.     Cholecalciferol (VITAMIN D) 50 MCG (2000 UT) tablet Take 4,000 Units by mouth daily.     desvenlafaxine (PRISTIQ) 50 MG 24 hr tablet Take 50 mg by mouth daily.     docusate sodium (COLACE) 100 MG  capsule Take 1 capsule (100 mg total) by mouth 2 (two) times daily. (Patient taking differently: Take 100 mg by mouth 2 (two) times daily as needed for mild constipation.) 30 capsule 0   Estradiol 10 MCG TABS vaginal tablet Place 10 mcg vaginally 2 (two) times a week.     estrogens-methylTEST (ESTRATEST) 1.25-2.5 MG tablet Take 1 tablet by mouth daily.     famotidine (PEPCID) 20 MG tablet Take 20 mg by mouth 2 (two) times daily.     labetalol (NORMODYNE) 100 MG tablet Take 100 mg by mouth 2 (two) times daily.     lamoTRIgine (LAMICTAL) 150 MG tablet Take 150 mg by mouth at bedtime.     mometasone (NASONEX) 50 MCG/ACT nasal spray Place 2 sprays into the nose every morning.     promethazine (PHENERGAN) 25 MG tablet Take 25 mg by mouth every 8 (eight) hours as needed for nausea/vomiting.     rosuvastatin (CRESTOR) 10 MG tablet Take 10 mg by mouth at bedtime.     aspirin EC 325 MG tablet Take 1 tablet (325 mg total) by mouth 2 (two) times daily after a meal. Take x 1 month post op to decrease risk of blood clots. (Patient not taking: Reported on 02/23/2021) 60 tablet 0    Blood pressure (!) 156/92, pulse 100, temperature 97.9 F (36.6 C), temperature source Oral, resp. rate 17, height 5\' 2"  (1.575 m), weight 89.4 kg, SpO2 96 %. Physical Exam: General: pleasant, WD, female who is laying in bed in NAD HEENT: head is normocephalic, atraumatic.  Sclera are noninjected.  Pupils equal and round. EOMs intact.  Ears and nose without any masses or lesions.  Mouth is pink and moist Heart: regular, rate, and rhythm.  Normal s1,s2. No obvious murmurs, gallops, or rubs noted.  Palpable radial and pedal pulses bilaterally Lungs: CTAB, no wheezes, rhonchi, or rales noted.  Respiratory effort nonlabored Abd: soft, hyperactive BS, moderately distended. Mild TTP diffusely without rebound or guarding NGT with small about dark bilious output MSK: all 4 extremities are symmetrical with no cyanosis, clubbing, or  edema. Skin: warm and dry with no masses, lesions, or rashes Neuro: Cranial nerves 2-12 grossly intact, sensation is normal throughout Psych: A&Ox3 with an appropriate affect.    Results for orders placed or performed during the hospital encounter of 02/22/21 (from the past 48 hour(s))  Resp Panel by RT-PCR (Flu A&B, Covid) Nasopharyngeal Swab     Status: None   Collection Time: 02/22/21  4:01 PM   Specimen: Nasopharyngeal Swab; Nasopharyngeal(NP) swabs in vial transport medium  Result Value Ref Range   SARS Coronavirus 2 by RT PCR NEGATIVE NEGATIVE    Comment: (NOTE) SARS-CoV-2 target nucleic acids are NOT DETECTED.  The SARS-CoV-2 RNA is generally detectable in upper respiratory specimens during the acute phase of infection. The lowest concentration of  SARS-CoV-2 viral copies this assay can detect is 138 copies/mL. A negative result does not preclude SARS-Cov-2 infection and should not be used as the sole basis for treatment or other patient management decisions. A negative result may occur with  improper specimen collection/handling, submission of specimen other than nasopharyngeal swab, presence of viral mutation(s) within the areas targeted by this assay, and inadequate number of viral copies(<138 copies/mL). A negative result must be combined with clinical observations, patient history, and epidemiological information. The expected result is Negative.  Fact Sheet for Patients:  EntrepreneurPulse.com.au  Fact Sheet for Healthcare Providers:  IncredibleEmployment.be  This test is no t yet approved or cleared by the [place] FDA and  has been authorized for detection and/or diagnosis of SARS-CoV-2 by FDA under an Emergency Use Authorization (EUA). This EUA will remain  in effect (meaning this test can be used) for the duration of the COVID-19 declaration under Section 564(b)(1) of the Act, 21 U.S.C.section 360bbb-3(b)(1), unless the  authorization is terminated  or revoked sooner.       Influenza A by PCR NEGATIVE NEGATIVE   Influenza B by PCR NEGATIVE NEGATIVE    Comment: (NOTE) The Xpert Xpress SARS-CoV-2/FLU/RSV plus assay is intended as an aid in the diagnosis of influenza from Nasopharyngeal swab specimens and should not be used as a sole basis for treatment. Nasal washings and aspirates are unacceptable for Xpert Xpress SARS-CoV-2/FLU/RSV testing.  Fact Sheet for Patients: EntrepreneurPulse.com.au  Fact Sheet for Healthcare Providers: IncredibleEmployment.be  This test is not yet approved or cleared by the [place] FDA and has been authorized for detection and/or diagnosis of SARS-CoV-2 by FDA under an Emergency Use Authorization (EUA). This EUA will remain in effect (meaning this test can be used) for the duration of the COVID-19 declaration under Section 564(b)(1) of the Act, 21 U.S.C. section 360bbb-3(b)(1), unless the authorization is terminated or revoked.  Performed at [facility], [address], [place], [place] [address]   CBC     Status: Abnormal   Collection Time: 02/22/21  4:07 PM  Result Value Ref Range   WBC 10.8 (H) 4.0 - 10.5 K/uL   RBC 5.82 (H) 3.87 - 5.11 MIL/uL   Hemoglobin 16.9 (H) 12.0 - 15.0 g/dL   HCT 52.4 (H) 36.0 - 46.0 %   MCV 90.0 80.0 - 100.0 fL   MCH 29.0 26.0 - 34.0 pg   MCHC 32.3 30.0 - 36.0 g/dL   RDW 13.9 11.5 - 15.5 %   Platelets 299 150 - 400 K/uL   nRBC 0.0 0.0 - 0.2 %    Comment: Performed at [facility], [address], [place], [place] [address]  Comprehensive metabolic panel     Status: Abnormal   Collection Time: 02/22/21  4:07 PM  Result Value Ref Range   Sodium 137 135 - 145 mmol/L   Potassium 3.8 3.5 - 5.1 mmol/L   Chloride 98 98 - 111 mmol/L   CO2 24 22 - 32 mmol/L   Glucose, Bld 150 (H) 70 - 99 mg/dL    Comment: Glucose reference range applies only to samples  taken after fasting for at least 8 hours.   BUN 20 8 - 23 mg/dL   Creatinine, Ser 1.67 (H) 0.44 - 1.00 mg/dL   Calcium 10.2 8.9 - 10.3 mg/dL   Total Protein 7.8 6.5 - 8.1 g/dL   Albumin 4.4 3.5 - 5.0 g/dL   AST 18 15 - 41 U/L   ALT 14 0 -  44 U/L   Alkaline Phosphatase 69 38 - 126 U/L   Total Bilirubin 0.8 0.3 - 1.2 mg/dL   GFR, Estimated 33 (L) >60 mL/min    Comment: (NOTE) Calculated using the CKD-EPI Creatinine Equation (2021)    Anion gap 15 5 - 15    Comment: Performed at [facility], [place], [place] [address]  Lipase, blood     Status: Abnormal   Collection Time: 02/22/21  4:07 PM  Result Value Ref Range   Lipase <10 (L) 11 - 51 U/L    Comment: Performed at [facility], [place], [place] [address]  Lactic acid, plasma     Status: None   Collection Time: 02/22/21  6:26 PM  Result Value Ref Range   Lactic Acid, Venous 1.4 0.5 - 1.9 mmol/L    Comment: Performed at [facility], [place], [place], [place] [address]  CBC     Status: Abnormal   Collection Time: 02/23/21  7:15 AM  Result Value Ref Range   WBC 7.4 4.0 - 10.5 K/uL   RBC 5.39 (H) 3.87 - 5.11 MIL/uL   Hemoglobin 16.1 (H) 12.0 - 15.0 g/dL   HCT 48.8 (H) 36.0 - 46.0 %   MCV 90.5 80.0 - 100.0 fL   MCH 29.9 26.0 - 34.0 pg   MCHC 33.0 30.0 - 36.0 g/dL   RDW 14.0 11.5 - 15.5 %   Platelets 294 150 - 400 K/uL   nRBC 0.0 0.0 - 0.2 %    Comment: Performed at [facility], [place] [address] [place], [place] [address]   CT ABDOMEN PELVIS WO CONTRAST  Result Date: 02/22/2021 CLINICAL DATA:  Nonlocalized abdominal pain.  Nausea. EXAM: CT ABDOMEN AND PELVIS WITHOUT CONTRAST TECHNIQUE: Multidetector CT imaging of the abdomen and pelvis was performed following the standard protocol without IV contrast. COMPARISON:  No Peri dominantly imaging. FINDINGS: Lower chest: Clustered micro nodules in the dependent right  lower lobe. No pleural effusion. Heart is normal in size. Hepatobiliary: 14 mm cyst in the right lobe of the liver. No suspicious liver lesion. The gallbladder is contracted. Question choleduodenal fistula, series 5, image 63. No calcified intraluminal gallstone. Mild common bile duct dilatation at 8 mm. No visualized pneumobilia. Pancreas: No ductal dilatation or inflammation. Spleen: Normal in size without focal abnormality. Adrenals/Urinary Tract: Normal adrenal glands. Mild left renal atrophy and renal parenchymal thinning. No hydronephrosis or renal calculi. 11 mm cyst arises from the lower right kidney. No evidence of solid renal lesion. Stomach/Bowel: Dilated fluid-filled stomach and small bowel. Transition point at 2.1 cm lamellated calcification (presumed gallstone) within the distal ileum, series 2, image 64. Loss of fat plane between the gallbladder and duodenum suspicious for choleduodenal fistula, series 5, image 63. Air within the non dependent duodenum and proximal small bowel is presumably intraluminal rather than pneumatosis. There is no non dependent air in the bowel wall. The appendix is not definitively visualized. The ascending, transverse, proximal descending colon are completely decompressed. Small volume of formed stool in the sigmoid colon. Moderate sigmoid colonic diverticulosis without diverticulitis. Vascular/Lymphatic: Normal caliber abdominal aorta with mild atherosclerosis. There is no portal venous or mesenteric gas. Scattered small mesenteric lymph nodes, likely reactive. There are few prominent bilateral inguinal nodes. Reproductive: Hysterectomy.  No adnexal mass. Other: Small volume free fluid/ascites within the pelvis and right upper quadrant. There is no free air. Mild generalized mesenteric edema. Musculoskeletal: Chronic bilateral L5 pars interarticularis  defects with grade 1 anterolisthesis of L5 on S1 and near complete L5-S1 disc space loss. Additional degenerative change  throughout the lumbar spine left hip arthroplasty. IMPRESSION: 1. Findings consistent with gallstone ileus/small bowel obstruction. Dilated fluid-filled stomach and small bowel with transition point in the distal ileum where there is a 2.1 cm lamellated calcification (presumed gallstone) in the distal ileum. Loss of fat plane between the gallbladder and duodenum suspicious for choleduodenal fistula. 2. Air within the non dependent duodenum and proximal small bowel is presumably intraluminal rather than pneumatosis. No free air. 3. Small volume free fluid/ascites within the pelvis and right upper quadrant. 4. Clustered micro nodules in the dependent right lower lobe, likely infectious or inflammatory. 5. Sigmoid colonic diverticulosis without diverticulitis. 6. Chronic bilateral L5 pars interarticularis defects with grade 1 anterolisthesis of L5 on S1 and near complete L5-S1 disc space loss. Aortic Atherosclerosis (ICD10-I70.0). Electronically Signed   By: Keith Rake M.D.   On: 02/22/2021 17:50   DG Abdomen 1 View  Result Date: 02/22/2021 CLINICAL DATA:  NG tube placement EXAM: ABDOMEN - 1 VIEW COMPARISON:  CT 02/22/2021 FINDINGS: Esophageal tube tip overlies the stomach, side-port in the region of GE junction. Multiple air distended small bowel loops consistent with a bowel obstruction IMPRESSION: Esophageal tube side-port in the region of GE junction, consider further advancement for more optimal positioning. Multiple dilated loops of small bowel consistent with a bowel obstruction. Electronically Signed   By: Donavan Foil M.D.   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 Surgical intervention discussed  Per primary: Pulm micronodules Mild polycythemia HTN CKD stage IIIb  Winferd Humphrey, [facility] 02/23/2021, 7:40 AM Please see Amion for pager number during day hours 7:00am-4:30pm

## 2021-02-23 NOTE — Plan of Care (Addendum)
Pt aox4, pleasant and cooperative. Ambulatory with assistance in room.  For OR today with Dr. Sheliah Hatch for Explor. Lap with SBR.  NGT in place, to LIS. Approx 150cc green liquid output.  IVF per orders.  Pain regimen in place.   Problem: Education: Goal: Knowledge of General Education information will improve Description: Including pain rating scale, medication(s)/side effects and non-pharmacologic comfort measures Outcome: Progressing   Problem: Health Behavior/Discharge Planning: Goal: Ability to manage health-related needs will improve Outcome: Progressing   Problem: Clinical Measurements: Goal: Ability to maintain clinical measurements within normal limits will improve Outcome: Progressing Goal: Will remain free from infection Outcome: Progressing Goal: Diagnostic test results will improve Outcome: Progressing Goal: Respiratory complications will improve Outcome: Progressing Goal: Cardiovascular complication will be avoided Outcome: Progressing   Problem: Activity: Goal: Risk for activity intolerance will decrease Outcome: Progressing   Problem: Nutrition: Goal: Adequate nutrition will be maintained Outcome: Progressing   Problem: Coping: Goal: Level of anxiety will decrease Outcome: Progressing   Problem: Elimination: Goal: Will not experience complications related to bowel motility Outcome: Progressing Goal: Will not experience complications related to urinary retention Outcome: Progressing   Problem: Pain Managment: Goal: General experience of comfort will improve Outcome: Progressing   Problem: Safety: Goal: Ability to remain free from injury will improve Outcome: Progressing   Problem: Skin Integrity: Goal: Risk for impaired skin integrity will decrease Outcome: Progressing

## 2021-02-23 NOTE — H&P (Signed)
History and Physical    Jacqueline Riggs [id number] DOB: 1955-05-18 DOA: 02/22/2021  PCP: Aura Dials, MD Patient coming from: [place] [facility]  Chief Complaint: Abdominal pain  HPI: Jacqueline Riggs is a 66 y.o. female with medical history significant of hypertension, hyperlipidemia, CKD stage III, GERD, OSA, depression presented to ED complaining of abdominal pain and distention.  In the ED, slightly tachycardic but not febrile.  Blood pressure elevated with systolic in the [id number] to [id number].  Labs showing WBC 10.8.  Hemoglobin 16.9.  Creatinine 1.6, no recent labs for comparison.  Creatinine was 1.4 in May 2020.  No elevation of lipase or LFTs.  Lactic acid normal.  COVID and flu negative.  CT abdomen pelvis showing findings consistent with gallstone ileus/SBO.  Clustered micronodules in the dependent right lower lobe, likely infectious or inflammatory. Patient was given morphine, Zofran, and 1 L normal saline bolus.  ED physician consulted Dr. Barry Dienes from general surgery and NG tube was placed.  Patient states she had a colonoscopy done on December 8.  After Christmas she had "stomach flu" and did recover from that illness.  However, 3 days ago she started having generalized abdominal pain and distention associated with nausea.  She has not vomited.  Her last bowel movement was 2 days ago.  Reports history of total abdominal hysterectomy but no other abdominal surgeries.  Denies history of gallstones or prior bowel obstruction.  Denies cough, fevers, or chest pain.  She has felt slightly short of breath since the onset of abdominal distention.  Review of Systems:  All systems reviewed and apart from history of presenting illness, are negative.  Past Medical History:  Diagnosis Date   Arthritis    Chronic kidney disease    Stage 3   Depression    Hypertension    Sleep apnea     Past Surgical History:  Procedure Laterality Date   DG THUMB LEFT HAND Left    replacement   JOINT REPLACEMENT Left  05/24/1998   JOINT REPLACEMENT Right 02/23/2005   KNEE ARTHROSCOPY Left 10/07/1992   08/03/07   KNEE ARTHROSCOPY Right 12/18/2002   Left finger trigger release Left 09/22/2005   LIPOMA EXCISION Right    Flank   MOUTH SURGERY  06/25/2002   TOTAL HIP ARTHROPLASTY Left 06/30/2018   Procedure: TOTAL HIP ARTHROPLASTY ANTERIOR APPROACH;  Surgeon: Dorna Leitz, MD;  Location: [facility];  Service: Orthopedics;  Laterality: Left;   TOTAL SHOULDER ARTHROPLASTY Right 01/04/2006   WISDOM TOOTH EXTRACTION  01/1971     reports that she quit smoking about 28 years ago. She has a 30.00 pack-year smoking history. She has never used smokeless tobacco. She reports current alcohol use of about 1.0 standard drink per week. She reports that she does not use drugs.  Allergies  Allergen Reactions   Penicillins Rash    Did it involve swelling of the face/tongue/throat, SOB, or low BP? Yes Did it involve sudden or severe rash/hives, skin peeling, or any reaction on the inside of your mouth or nose? No Did you need to seek medical attention at a hospital or doctor's office? Yes When did it last happen? Late 1980s If all above answers are "NO", may proceed with cephalosporin use.    Dexilant [Dexlansoprazole] Other (See Comments)    Stomach pain and cramps   Prilosec [Omeprazole] Other (See Comments)    Stomach pain and cramps   Shellfish Allergy Diarrhea    Oysters and mussles   Ceclor [Cefaclor] Rash  Hydrochlorothiazide Rash   Latex Rash    Contact dermatitis   Relafen [Nabumetone] Rash    Also, no NSAIDs due to kidney function   Sulfa Antibiotics Rash    History reviewed. No pertinent family history.  Prior to Admission medications   Medication Sig Start Date End Date Taking? Authorizing Provider  amLODipine (NORVASC) 10 MG tablet Take 10 mg by mouth daily. 06/19/18  Yes [provider]  calcium carbonate (TUMS - DOSED IN MG ELEMENTAL CALCIUM) 500 MG chewable tablet Chew 3 tablets by mouth  at bedtime.   Yes [provider]  cetirizine (ZYRTEC) 10 MG tablet Take 10 mg by mouth at bedtime.   Yes [provider]  Cholecalciferol (VITAMIN D) 50 MCG (2000 UT) tablet Take 4,000 Units by mouth daily.   Yes [provider]  desvenlafaxine (PRISTIQ) 100 MG 24 hr tablet Take 100 mg by mouth daily.   Yes [provider]  docusate sodium (COLACE) 100 MG capsule Take 1 capsule (100 mg total) by mouth 2 (two) times daily. 06/30/18  Yes Gary Fleet, PA-C  Estradiol 10 MCG TABS vaginal tablet Place 10 mcg vaginally 2 (two) times a week. 04/18/18  Yes [provider]  estrogens-methylTEST (ESTRATEST) 1.25-2.5 MG tablet Take 1 tablet by mouth daily. 05/11/18  Yes [provider]  famotidine (PEPCID) 20 MG tablet Take 20 mg by mouth 2 (two) times daily. 06/15/18  Yes [provider]  labetalol (NORMODYNE) 100 MG tablet Take 100 mg by mouth 2 (two) times daily. 04/17/18  Yes [provider]  lamoTRIgine (LAMICTAL) 150 MG tablet Take 150 mg by mouth at bedtime. 04/23/18  Yes [provider]  mometasone (NASONEX) 50 MCG/ACT nasal spray Place 2 sprays into the nose 2 (two) times a day.   Yes [provider]  rosuvastatin (CRESTOR) 10 MG tablet Take 10 mg by mouth at bedtime. 06/15/18  Yes [provider]  acetaminophen (TYLENOL) 500 MG tablet Take 500-1,000 mg by mouth every 6 (six) hours as needed for mild pain or headache.    [provider]  aspirin EC 325 MG tablet Take 1 tablet (325 mg total) by mouth 2 (two) times daily after a meal. Take x 1 month post op to decrease risk of blood clots. 06/30/18   Gary Fleet, PA-C  nefazodone (SERZONE) 250 MG tablet Take 250 mg by mouth 2 (two) times a day. 05/01/18   [provider]  oxyCODONE-acetaminophen (PERCOCET/ROXICET) 5-325 MG tablet Take 1-2 tablets by mouth every 6 (six) hours as needed for severe pain. 06/30/18   Gary Fleet, PA-C  Propylene  Glycol (SYSTANE COMPLETE OP) Place 1 drop into both eyes at bedtime.    [provider]  tiZANidine (ZANAFLEX) 2 MG tablet Take 1 tablet (2 mg total) by mouth every 8 (eight) hours as needed for muscle spasms. 06/30/18   Gary Fleet, PA-C  traMADol (ULTRAM) 50 MG tablet Take 50 mg by mouth every 8 (eight) hours as needed for moderate pain.  06/19/18   [provider]    Physical Exam: Vitals:   02/23/21 0000 02/23/21 0100 02/23/21 0149 02/23/21 0233  BP: (!) 167/106 (!) 171/107 (!) 170/112 (!) 156/92  Pulse: 96 96 (!) 107 100  Resp:  18 18 17   Temp:  98 F (36.7 C) 98 F (36.7 C) 97.9 F (36.6 C)  TempSrc:  Oral Oral Oral  SpO2: 95% 95% 95% 96%  Weight:      Height:  Physical Exam Constitutional:      General: She is not in acute distress. HENT:     Head: Normocephalic and atraumatic.  Eyes:     Extraocular Movements: Extraocular movements intact.     Conjunctiva/sclera: Conjunctivae normal.  Cardiovascular:     Rate and Rhythm: Normal rate and regular rhythm.     Pulses: Normal pulses.  Pulmonary:     Effort: Pulmonary effort is normal. No respiratory distress.     Breath sounds: Normal breath sounds. No wheezing or rales.  Abdominal:     General: Bowel sounds are normal. There is distension.     Palpations: Abdomen is soft.     Tenderness: There is abdominal tenderness. There is no guarding or rebound.     Comments: Abdomen distended with mild generalized tenderness to palpation  Musculoskeletal:        General: No swelling or tenderness.     Cervical back: Normal range of motion and neck supple.  Skin:    General: Skin is warm and dry.  Neurological:     General: No focal deficit present.     Mental Status: She is alert and oriented to person, place, and time.     Labs on Admission: I have personally reviewed following labs and imaging studies  CBC: Recent Labs  Lab 02/22/21 1607  WBC 10.8*  HGB 16.9*  HCT 52.4*  MCV 90.0  PLT [id number]    Basic Metabolic Panel: Recent Labs  Lab 02/22/21 1607  NA 137  K 3.8  CL 98  CO2 24  GLUCOSE 150*  BUN 20  CREATININE 1.67*  CALCIUM 10.2   GFR: Estimated Creatinine Clearance: 34 mL/min (A) (by C-G formula based on SCr of 1.67 mg/dL (H)). Liver Function Tests: Recent Labs  Lab 02/22/21 1607  AST 18  ALT 14  ALKPHOS 69  BILITOT 0.8  PROT 7.8  ALBUMIN 4.4   Recent Labs  Lab 02/22/21 1607  LIPASE <10*   No results for input(s): AMMONIA in the last 168 hours. Coagulation Profile: No results for input(s): INR, PROTIME in the last 168 hours. Cardiac Enzymes: No results for input(s): CKTOTAL, CKMB, CKMBINDEX, TROPONINI in the last 168 hours. BNP (last 3 results) No results for input(s): PROBNP in the last 8760 hours. HbA1C: No results for input(s): HGBA1C in the last 72 hours. CBG: No results for input(s): GLUCAP in the last 168 hours. Lipid Profile: No results for input(s): CHOL, HDL, LDLCALC, TRIG, CHOLHDL, LDLDIRECT in the last 72 hours. Thyroid Function Tests: No results for input(s): TSH, T4TOTAL, FREET4, T3FREE, THYROIDAB in the last 72 hours. Anemia Panel: No results for input(s): VITAMINB12, FOLATE, FERRITIN, TIBC, IRON, RETICCTPCT in the last 72 hours. Urine analysis:    Component Value Date/Time   COLORURINE AMBER (A) 06/26/2018 1409   APPEARANCEUR HAZY (A) 06/26/2018 1409   LABSPEC 1.027 06/26/2018 1409   PHURINE 5.0 06/26/2018 1409   GLUCOSEU NEGATIVE 06/26/2018 1409   HGBUR NEGATIVE 06/26/2018 1409   BILIRUBINUR SMALL (A) 06/26/2018 1409   KETONESUR 5 (A) 06/26/2018 1409   PROTEINUR NEGATIVE 06/26/2018 1409   NITRITE NEGATIVE 06/26/2018 1409   LEUKOCYTESUR NEGATIVE 06/26/2018 1409    Radiological Exams on Admission: CT ABDOMEN PELVIS WO CONTRAST  Result Date: 02/22/2021 CLINICAL DATA:  Nonlocalized abdominal pain.  Nausea. EXAM: CT ABDOMEN AND PELVIS WITHOUT CONTRAST TECHNIQUE: Multidetector CT imaging of the abdomen and pelvis was  performed following the standard protocol without IV contrast. COMPARISON:  No Peri dominantly imaging. FINDINGS: Lower  chest: Clustered micro nodules in the dependent right lower lobe. No pleural effusion. Heart is normal in size. Hepatobiliary: 14 mm cyst in the right lobe of the liver. No suspicious liver lesion. The gallbladder is contracted. Question choleduodenal fistula, series 5, image 63. No calcified intraluminal gallstone. Mild common bile duct dilatation at 8 mm. No visualized pneumobilia. Pancreas: No ductal dilatation or inflammation. Spleen: Normal in size without focal abnormality. Adrenals/Urinary Tract: Normal adrenal glands. Mild left renal atrophy and renal parenchymal thinning. No hydronephrosis or renal calculi. 11 mm cyst arises from the lower right kidney. No evidence of solid renal lesion. Stomach/Bowel: Dilated fluid-filled stomach and small bowel. Transition point at 2.1 cm lamellated calcification (presumed gallstone) within the distal ileum, series 2, image 64. Loss of fat plane between the gallbladder and duodenum suspicious for choleduodenal fistula, series 5, image 63. Air within the non dependent duodenum and proximal small bowel is presumably intraluminal rather than pneumatosis. There is no non dependent air in the bowel wall. The appendix is not definitively visualized. The ascending, transverse, proximal descending colon are completely decompressed. Small volume of formed stool in the sigmoid colon. Moderate sigmoid colonic diverticulosis without diverticulitis. Vascular/Lymphatic: Normal caliber abdominal aorta with mild atherosclerosis. There is no portal venous or mesenteric gas. Scattered small mesenteric lymph nodes, likely reactive. There are few prominent bilateral inguinal nodes. Reproductive: Hysterectomy.  No adnexal mass. Other: Small volume free fluid/ascites within the pelvis and right upper quadrant. There is no free air. Mild generalized mesenteric edema.  Musculoskeletal: Chronic bilateral L5 pars interarticularis defects with grade 1 anterolisthesis of L5 on S1 and near complete L5-S1 disc space loss. Additional degenerative change throughout the lumbar spine left hip arthroplasty. IMPRESSION: 1. Findings consistent with gallstone ileus/small bowel obstruction. Dilated fluid-filled stomach and small bowel with transition point in the distal ileum where there is a 2.1 cm lamellated calcification (presumed gallstone) in the distal ileum. Loss of fat plane between the gallbladder and duodenum suspicious for choleduodenal fistula. 2. Air within the non dependent duodenum and proximal small bowel is presumably intraluminal rather than pneumatosis. No free air. 3. Small volume free fluid/ascites within the pelvis and right upper quadrant. 4. Clustered micro nodules in the dependent right lower lobe, likely infectious or inflammatory. 5. Sigmoid colonic diverticulosis without diverticulitis. 6. Chronic bilateral L5 pars interarticularis defects with grade 1 anterolisthesis of L5 on S1 and near complete L5-S1 disc space loss. Aortic Atherosclerosis (ICD10-I70.0). Electronically Signed   By: Keith Rake M.D.   On: 02/22/2021 17:50   DG Abdomen 1 View  Result Date: 02/22/2021 CLINICAL DATA:  NG tube placement EXAM: ABDOMEN - 1 VIEW COMPARISON:  CT 02/22/2021 FINDINGS: Esophageal tube tip overlies the stomach, side-port in the region of GE junction. Multiple air distended small bowel loops consistent with a bowel obstruction IMPRESSION: Esophageal tube side-port in the region of GE junction, consider further advancement for more optimal positioning. Multiple dilated loops of small bowel consistent with a bowel obstruction. Electronically Signed   By: Donavan Foil M.D.   On: 02/22/2021 19:41    Assessment/Plan Principal Problem:   Gallstone ileus ([place]) Active Problems:   SBO (small bowel obstruction) ([place])   Essential hypertension   Hyperlipidemia   CKD  (chronic kidney disease), stage III ([facility])   Gallstone ileus with SBO -Keep n.p.o., bowel rest -NG tube in place -IV fluid hydration -Morphine as needed for pain -Antiemetic as needed -Monitor electrolytes -Serial abdominal exams -General surgery (Dr. Barry Dienes) consulted by ED physician.  I received a message from Dr. Georgette Dover stating surgery team is aware of the patient and will consult in a.m.  Pulmonary micronodules CT showing clustered micronodules in the dependent right lower lobe, likely infectious or inflammatory.  No fever or significant leukocytosis.  She does report history of heavy cigarette smoking for about 10 years in the past, quit at age of 28. -Check procalcitonin level.  She will need dedicated chest CT for further evaluation after resolution of gallstone ileus/SBO.  Mild polycythemia -Repeat CBC to confirm  Hypertension -IV labetalol prn SBP >170  CKD stage IIIb Creatinine 1.6, no recent labs for comparison.  Creatinine was 1.4 in May 2020. -Continue to monitor renal function  DVT prophylaxis: SCDs Code Status: Patient wishes to be full code. Family Communication: No family available at this time. Disposition Plan: Status is: Inpatient  Remains inpatient appropriate because: Gallstone ileus/SBO with NG tube  Level of care: Level of care: Med-Surg  The medical decision making on this patient was of high complexity and the patient is at high risk for clinical deterioration, therefore this is a level 3 visit.  Shela Leff MD [facility]  If 7PM-7AM, please contact night-coverage [email or web address]  02/23/2021, 6:39 AM

## 2021-02-23 NOTE — ED Notes (Signed)
[facility] here for transport.  

## 2021-02-23 NOTE — Transfer of Care (Signed)
Immediate Anesthesia Transfer of Care Note  Patient: Jacqueline Riggs  Procedure(s) Performed: EXPLORATORY LAPAROTOMY, small bowel resection of anastomosis (Abdomen)  Patient Location: PACU  Anesthesia Type:General  Level of Consciousness: awake, alert  and oriented  Airway & Oxygen Therapy: Patient Spontanous Breathing and Patient connected to face mask oxygen  Post-op Assessment: Report given to RN and Post -op Vital signs reviewed and stable  Post vital signs: Reviewed and stable  Last Vitals:  Vitals Value Taken Time  BP 200/100 02/23/21 1157  Temp    Pulse 82 02/23/21 1159  Resp 15 02/23/21 1159  SpO2 99 % 02/23/21 1159  Vitals shown include unvalidated device data.  Last Pain:  Vitals:   02/23/21 0939  TempSrc: Oral  PainSc:          Complications: No notable events documented.

## 2021-02-24 ENCOUNTER — Encounter (HOSPITAL_COMMUNITY): Payer: Self-pay | Admitting: General Surgery

## 2021-02-24 LAB — CBC
HCT: 49 % — ABNORMAL HIGH (ref 36.0–46.0)
Hemoglobin: 16.3 g/dL — ABNORMAL HIGH (ref 12.0–15.0)
MCH: 29.9 pg (ref 26.0–34.0)
MCHC: 33.3 g/dL (ref 30.0–36.0)
MCV: 89.7 fL (ref 80.0–100.0)
Platelets: 277 10*3/uL (ref 150–400)
RBC: 5.46 MIL/uL — ABNORMAL HIGH (ref 3.87–5.11)
RDW: 14 % (ref 11.5–15.5)
WBC: 12.4 10*3/uL — ABNORMAL HIGH (ref 4.0–10.5)
nRBC: 0 % (ref 0.0–0.2)

## 2021-02-24 LAB — COMPREHENSIVE METABOLIC PANEL
ALT: 17 U/L (ref 0–44)
AST: 27 U/L (ref 15–41)
Albumin: 3.3 g/dL — ABNORMAL LOW (ref 3.5–5.0)
Alkaline Phosphatase: 54 U/L (ref 38–126)
Anion gap: 10 (ref 5–15)
BUN: 20 mg/dL (ref 8–23)
CO2: 25 mmol/L (ref 22–32)
Calcium: 8.4 mg/dL — ABNORMAL LOW (ref 8.9–10.3)
Chloride: 100 mmol/L (ref 98–111)
Creatinine, Ser: 1.11 mg/dL — ABNORMAL HIGH (ref 0.44–1.00)
GFR, Estimated: 54 mL/min — ABNORMAL LOW (ref >60)
Glucose, Bld: 125 mg/dL — ABNORMAL HIGH (ref 70–99)
Potassium: 3.9 mmol/L (ref 3.5–5.1)
Sodium: 135 mmol/L (ref 135–145)
Total Bilirubin: 1.8 mg/dL — ABNORMAL HIGH (ref 0.3–1.2)
Total Protein: 7.1 g/dL (ref 6.5–8.1)

## 2021-02-24 LAB — MAGNESIUM: Magnesium: 2.2 mg/dL (ref 1.7–2.4)

## 2021-02-24 LAB — PHOSPHORUS: Phosphorus: 2.6 mg/dL (ref 2.5–4.6)

## 2021-02-24 MED ORDER — LACTATED RINGERS IV SOLN: INTRAVENOUS | Status: DC

## 2021-02-24 MED ORDER — PHENOL 1.4 % MT LIQD
1.0000 | OROMUCOSAL | Status: DC | PRN
  Filled 2021-02-24: qty 177

## 2021-02-24 MED ORDER — PANTOPRAZOLE SODIUM 40 MG IV SOLR
40.0000 mg | Freq: Two times a day (BID) | INTRAVENOUS | Status: DC
  Administered 2021-02-24 – 2021-02-27 (×7): 40 mg via INTRAVENOUS
  Filled 2021-02-24 (×7): qty 40

## 2021-02-24 NOTE — Progress Notes (Signed)
Pt having dark brown drainage with red tint at times.  [facility] with Surgery rounding and notified at bedside. No new  orders. Will continue to monitor.

## 2021-02-24 NOTE — Progress Notes (Addendum)
But no PROGRESS NOTE  KAYLENA PETSCHE [id number] DOB: [birth year] DOA: 02/22/2021 PCP: Aura Dials, MD   LOS: 1 day   Brief narrative:  LILINOE SANTILLI is a 66 y.o. female with past medical history of hypertension, hyperlipidemia, CKD stage III, GERD, obstructive sleep apnea, depression presented to hospital with abdominal pain and distention.  In the ED, patient was noted to be tachycardic and febrile with elevated blood pressure.  Labs showed WBC at 13.8 with creatinine of 1.6 with baseline 1.4 in May 2020.  CT scan of the abdomen and pelvis showed gallstone ileus and small bowel obstruction.   Of note, patient had colonoscopy on December 8th with a previous history of total abdominal hysterectomy.  General surgery was consulted from the ED NG tube was placed and patient was then admitted to the hospital for further evaluation and treatment.  Assessment/Plan:  Principal Problem:   Gallstone ileus ([place]) Active Problems:   SBO (small bowel obstruction) ([place])   Essential hypertension   Hyperlipidemia   CKD (chronic kidney disease), stage III ([place])  Gallstone ileus with small bowel obstruction. Status post exploratory laparotomy with small bowel resection and anastomosis by Dr. Kieth Brightly on 02/23/2021.  Currently on NG tube n.p.o. IV fluids.  NG tube with copious fluid.  PT OT incentive spirometry and out of bed to chair.  Encouraged deep breathing.  Further management as per surgical team  Pulmonary micronodules CT scan showing clustered micronodules.  History of smoking 10 years ago.  Procalcitonin 0.1.  Might benefit from dedicated chest CT for further evaluation after resolution of gallstone ileus/SBO.   Mild polycythemia Latest hemoglobin of 16.3 on IV fluids.  We will continue to monitor.  Hypertension Continue IV labetalol prn SBP >170 since the patient is NPO.  Added hydralazine as needed yesterday.   Acute kidney injury on CKD stage IIIb Creatinine 1.1 today.  Baseline creatinine  of around 1.4 in May 2020.  Continue with IV fluids for now since the patient is n.p.o.   DVT prophylaxis: SCDs Start: 02/23/21 [id number]   Code Status: Full code  Family Communication:  I spoke with the patient's husband at bedside on 02/23/2021..  Status is: Inpatient  Remains inpatient appropriate because: Status post surgical intervention postoperative day 1,, IV fluids   Consultants: General surgery  Procedures: NG tube placement Exploratory laparotomy with bowel resection and anastomosis on 02/23/2021.  Anti-infectives:  None now  Anti-infectives (From admission, onward)    Start     Dose/Rate Route Frequency Ordered Stop   02/23/21 0845  ciprofloxacin (CIPRO) IVPB 400 mg        400 mg 200 mL/hr over 60 Minutes Intravenous  Once 02/23/21 0745 02/23/21 1041   02/23/21 0845  metroNIDAZOLE (FLAGYL) IVPB 500 mg        500 mg 100 mL/hr over 60 Minutes Intravenous  Once 02/23/21 0745 02/23/21 1124      Subjective: Today, patient was seen and examined at bedside.  Patient denies any nausea vomiting but has copious NG output.  Denies any abdominal pain.  Slept well.    Objective: Vitals:   02/24/21 0403 02/24/21 0735  BP: (!) 173/105 (!) 161/97  Pulse: (!) 105 97  Resp: 18   Temp: 97.6 F (36.4 C)   SpO2: 97%     Intake/Output Summary (Last 24 hours) at 02/24/2021 1048 Last data filed at 02/24/2021 0731 Gross per 24 hour  Intake 1806.69 ml  Output 2375 ml  Net -568.31 ml  Filed Weights   02/22/21 1558  Weight: 89.4 kg   Body mass index is 36.03 kg/m.   Physical Exam: General: Obese built, not in obvious distress, NG tube in place HENT:   No scleral pallor or icterus noted. Oral mucosa is moist.  NG tube in place with dark fluid. Chest:  Diminished breath sounds bilaterally. No crackles or wheezes.  CVS: S1 &S2 heard. No murmur.  Regular rate and rhythm. Abdomen: Soft, Status post laparoscopic surgery with dressing.  Bowel sounds are heard.   Extremities:  No cyanosis, clubbing or edema.  Peripheral pulses are palpable. Psych: Alert, awake and oriented, normal mood CNS:  No cranial nerve deficits.  Power equal in all extremities.   Skin: Warm and dry.  No rashes noted.   Data Review: I have personally reviewed the following laboratory data and studies,  CBC: Recent Labs  Lab 02/22/21 1607 02/23/21 0715 02/24/21 0439  WBC 10.8* 7.4 12.4*  HGB 16.9* 16.1* 16.3*  HCT 52.4* 48.8* 49.0*  MCV 90.0 90.5 89.7  PLT 299 294 [id number]    Basic Metabolic Panel: Recent Labs  Lab 02/22/21 1607 02/23/21 0715 02/24/21 0439  NA 137 138 135  K 3.8 3.6 3.9  CL 98 102 100  CO2 24 24 25   GLUCOSE 150* 109* 125*  BUN 20 31* 20  CREATININE 1.67* 1.74* 1.11*  CALCIUM 10.2 9.5 8.4*  MG  --  2.2 2.2  PHOS  --   --  2.6    Liver Function Tests: Recent Labs  Lab 02/22/21 1607 02/24/21 0439  AST 18 27  ALT 14 17  ALKPHOS 69 54  BILITOT 0.8 1.8*  PROT 7.8 7.1  ALBUMIN 4.4 3.3*    Recent Labs  Lab 02/22/21 1607  LIPASE <10*    No results for input(s): AMMONIA in the last 168 hours. Cardiac Enzymes: No results for input(s): CKTOTAL, CKMB, CKMBINDEX, TROPONINI in the last 168 hours. BNP (last 3 results) No results for input(s): BNP in the last 8760 hours.  ProBNP (last 3 results) No results for input(s): PROBNP in the last 8760 hours.  CBG: No results for input(s): GLUCAP in the last 168 hours. Recent Results (from the past 240 hour(s))  Resp Panel by RT-PCR (Flu A&B, Covid) Nasopharyngeal Swab     Status: None   Collection Time: 02/22/21  4:01 PM   Specimen: Nasopharyngeal Swab; Nasopharyngeal(NP) swabs in vial transport medium  Result Value Ref Range Status   SARS Coronavirus 2 by RT PCR NEGATIVE NEGATIVE Final    Comment: (NOTE) SARS-CoV-2 target nucleic acids are NOT DETECTED.  The SARS-CoV-2 RNA is generally detectable in upper respiratory specimens during the acute phase of infection. The lowest concentration of SARS-CoV-2  viral copies this assay can detect is 138 copies/mL. A negative result does not preclude SARS-Cov-2 infection and should not be used as the sole basis for treatment or other patient management decisions. A negative result may occur with  improper specimen collection/handling, submission of specimen other than nasopharyngeal swab, presence of viral mutation(s) within the areas targeted by this assay, and inadequate number of viral copies(<138 copies/mL). A negative result must be combined with clinical observations, patient history, and epidemiological information. The expected result is Negative.  Fact Sheet for Patients:  EntrepreneurPulse.com.au  Fact Sheet for Healthcare Providers:  IncredibleEmployment.be  This test is no t yet approved or cleared by the [place] FDA and  has been authorized for detection and/or diagnosis of SARS-CoV-2 by FDA under an  Emergency Use Authorization (EUA). This EUA will remain  in effect (meaning this test can be used) for the duration of the COVID-19 declaration under Section 564(b)(1) of the Act, 21 U.S.C.section 360bbb-3(b)(1), unless the authorization is terminated  or revoked sooner.       Influenza A by PCR NEGATIVE NEGATIVE Final   Influenza B by PCR NEGATIVE NEGATIVE Final    Comment: (NOTE) The Xpert Xpress SARS-CoV-2/FLU/RSV plus assay is intended as an aid in the diagnosis of influenza from Nasopharyngeal swab specimens and should not be used as a sole basis for treatment. Nasal washings and aspirates are unacceptable for Xpert Xpress SARS-CoV-2/FLU/RSV testing.  Fact Sheet for Patients: EntrepreneurPulse.com.au  Fact Sheet for Healthcare Providers: IncredibleEmployment.be  This test is not yet approved or cleared by the [place] FDA and has been authorized for detection and/or diagnosis of SARS-CoV-2 by FDA under an Emergency Use Authorization  (EUA). This EUA will remain in effect (meaning this test can be used) for the duration of the COVID-19 declaration under Section 564(b)(1) of the Act, 21 U.S.C. section 360bbb-3(b)(1), unless the authorization is terminated or revoked.  Performed at [facility], [address] [place], [place] [address]       Studies: CT ABDOMEN PELVIS WO CONTRAST  Result Date: 02/22/2021 CLINICAL DATA:  Nonlocalized abdominal pain.  Nausea. EXAM: CT ABDOMEN AND PELVIS WITHOUT CONTRAST TECHNIQUE: Multidetector CT imaging of the abdomen and pelvis was performed following the standard protocol without IV contrast. COMPARISON:  No Peri dominantly imaging. FINDINGS: Lower chest: Clustered micro nodules in the dependent right lower lobe. No pleural effusion. Heart is normal in size. Hepatobiliary: 14 mm cyst in the right lobe of the liver. No suspicious liver lesion. The gallbladder is contracted. Question choleduodenal fistula, series 5, image 63. No calcified intraluminal gallstone. Mild common bile duct dilatation at 8 mm. No visualized pneumobilia. Pancreas: No ductal dilatation or inflammation. Spleen: Normal in size without focal abnormality. Adrenals/Urinary Tract: Normal adrenal glands. Mild left renal atrophy and renal parenchymal thinning. No hydronephrosis or renal calculi. 11 mm cyst arises from the lower right kidney. No evidence of solid renal lesion. Stomach/Bowel: Dilated fluid-filled stomach and small bowel. Transition point at 2.1 cm lamellated calcification (presumed gallstone) within the distal ileum, series 2, image 64. Loss of fat plane between the gallbladder and duodenum suspicious for choleduodenal fistula, series 5, image 63. Air within the non dependent duodenum and proximal small bowel is presumably intraluminal rather than pneumatosis. There is no non dependent air in the bowel wall. The appendix is not definitively visualized. The ascending, transverse, proximal descending  colon are completely decompressed. Small volume of formed stool in the sigmoid colon. Moderate sigmoid colonic diverticulosis without diverticulitis. Vascular/Lymphatic: Normal caliber abdominal aorta with mild atherosclerosis. There is no portal venous or mesenteric gas. Scattered small mesenteric lymph nodes, likely reactive. There are few prominent bilateral inguinal nodes. Reproductive: Hysterectomy.  No adnexal mass. Other: Small volume free fluid/ascites within the pelvis and right upper quadrant. There is no free air. Mild generalized mesenteric edema. Musculoskeletal: Chronic bilateral L5 pars interarticularis defects with grade 1 anterolisthesis of L5 on S1 and near complete L5-S1 disc space loss. Additional degenerative change throughout the lumbar spine left hip arthroplasty. IMPRESSION: 1. Findings consistent with gallstone ileus/small bowel obstruction. Dilated fluid-filled stomach and small bowel with transition point in the distal ileum where there is a 2.1 cm lamellated calcification (presumed gallstone) in the distal ileum. Loss of fat plane between the gallbladder and duodenum  suspicious for choleduodenal fistula. 2. Air within the non dependent duodenum and proximal small bowel is presumably intraluminal rather than pneumatosis. No free air. 3. Small volume free fluid/ascites within the pelvis and right upper quadrant. 4. Clustered micro nodules in the dependent right lower lobe, likely infectious or inflammatory. 5. Sigmoid colonic diverticulosis without diverticulitis. 6. Chronic bilateral L5 pars interarticularis defects with grade 1 anterolisthesis of L5 on S1 and near complete L5-S1 disc space loss. Aortic Atherosclerosis (ICD10-I70.0). Electronically Signed   By: Keith Rake M.D.   On: 02/22/2021 17:50   DG Abdomen 1 View  Result Date: 02/22/2021 CLINICAL DATA:  NG tube placement EXAM: ABDOMEN - 1 VIEW COMPARISON:  CT 02/22/2021 FINDINGS: Esophageal tube tip overlies the stomach,  side-port in the region of GE junction. Multiple air distended small bowel loops consistent with a bowel obstruction IMPRESSION: Esophageal tube side-port in the region of GE junction, consider further advancement for more optimal positioning. Multiple dilated loops of small bowel consistent with a bowel obstruction. Electronically Signed   By: Donavan Foil M.D.   On: 02/22/2021 19:41      Flora Lipps, MD  [facility] 02/24/2021  If 7PM-7AM, please contact night-coverage

## 2021-02-24 NOTE — Progress Notes (Signed)
Progress Note  1 Day Post-Op  Subjective: Having some expected post op abdominal pain. No flatus yet but has not ambulated. No nausea/emesis  Objective: Vital signs in last 24 hours: Temp:  [97.6 F (36.4 C)-98.7 F (37.1 C)] 97.6 F (36.4 C) (01/10 0403) Pulse Rate:  [82-105] 105 (01/10 0403) Resp:  [12-20] 18 (01/10 0403) BP: (136-189)/(88-105) 173/105 (01/10 0403) SpO2:  [93 %-99 %] 97 % (01/10 0403) Last BM Date: 02/20/21  Intake/Output from previous day: 01/09 0701 - 01/10 0700 In: 1786.7 [I.V.:1579.2; IV Piggyback:207.5] Out: 2525 [Urine:2000; Emesis/NG output:425; Blood:100] Intake/Output this shift: Total I/O In: 20 [NG/GT:20] Out: -   PE: General: pleasant, WD, female who is laying in bed in NAD HEENT: head is normocephalic, atraumatic. Mouth is pink and moist Heart: regular, rate, and rhythm. Palpable radial pulses bilaterally Lungs: CTAB.  Respiratory effort nonlabored Abd: soft, +BS, very mildly distended. Mild expected TTP around midline incision which is covered by honeycomb dressing and is c/d/I with staples NGT with red tinged bilious output GU: foley catheter draining clear yellow urine MSK: all 4 extremities are symmetrical with no cyanosis, clubbing, or edema. No calf TTP bilaterally Skin: warm and dry Psych: A&Ox3 with an appropriate affect.    Lab Results:  Recent Labs    02/23/21 0715 02/24/21 0439  WBC 7.4 12.4*  HGB 16.1* 16.3*  HCT 48.8* 49.0*  PLT 294 277   BMET Recent Labs    02/23/21 0715 02/24/21 0439  NA 138 135  K 3.6 3.9  CL 102 100  CO2 24 25  GLUCOSE 109* 125*  BUN 31* 20  CREATININE 1.74* 1.11*  CALCIUM 9.5 8.4*   PT/INR No results for input(s): LABPROT, INR in the last 72 hours. CMP     Component Value Date/Time   NA 135 02/24/2021 0439   K 3.9 02/24/2021 0439   CL 100 02/24/2021 0439   CO2 25 02/24/2021 0439   GLUCOSE 125 (H) 02/24/2021 0439   BUN 20 02/24/2021 0439   CREATININE 1.11 (H) 02/24/2021  0439   CALCIUM 8.4 (L) 02/24/2021 0439   PROT 7.1 02/24/2021 0439   ALBUMIN 3.3 (L) 02/24/2021 0439   AST 27 02/24/2021 0439   ALT 17 02/24/2021 0439   ALKPHOS 54 02/24/2021 0439   BILITOT 1.8 (H) 02/24/2021 0439   GFRNONAA 54 (L) 02/24/2021 0439   GFRAA 45 (L) 06/26/2018 1409   Lipase     Component Value Date/Time   LIPASE <10 (L) 02/22/2021 1607       Studies/Results: CT ABDOMEN PELVIS WO CONTRAST  Result Date: 02/22/2021 CLINICAL DATA:  Nonlocalized abdominal pain.  Nausea. EXAM: CT ABDOMEN AND PELVIS WITHOUT CONTRAST TECHNIQUE: Multidetector CT imaging of the abdomen and pelvis was performed following the standard protocol without IV contrast. COMPARISON:  No Peri dominantly imaging. FINDINGS: Lower chest: Clustered micro nodules in the dependent right lower lobe. No pleural effusion. Heart is normal in size. Hepatobiliary: 14 mm cyst in the right lobe of the liver. No suspicious liver lesion. The gallbladder is contracted. Question choleduodenal fistula, series 5, image 63. No calcified intraluminal gallstone. Mild common bile duct dilatation at 8 mm. No visualized pneumobilia. Pancreas: No ductal dilatation or inflammation. Spleen: Normal in size without focal abnormality. Adrenals/Urinary Tract: Normal adrenal glands. Mild left renal atrophy and renal parenchymal thinning. No hydronephrosis or renal calculi. 11 mm cyst arises from the lower right kidney. No evidence of solid renal lesion. Stomach/Bowel: Dilated fluid-filled stomach and small bowel. Transition point  at 2.1 cm lamellated calcification (presumed gallstone) within the distal ileum, series 2, image 64. Loss of fat plane between the gallbladder and duodenum suspicious for choleduodenal fistula, series 5, image 63. Air within the non dependent duodenum and proximal small bowel is presumably intraluminal rather than pneumatosis. There is no non dependent air in the bowel wall. The appendix is not definitively visualized. The  ascending, transverse, proximal descending colon are completely decompressed. Small volume of formed stool in the sigmoid colon. Moderate sigmoid colonic diverticulosis without diverticulitis. Vascular/Lymphatic: Normal caliber abdominal aorta with mild atherosclerosis. There is no portal venous or mesenteric gas. Scattered small mesenteric lymph nodes, likely reactive. There are few prominent bilateral inguinal nodes. Reproductive: Hysterectomy.  No adnexal mass. Other: Small volume free fluid/ascites within the pelvis and right upper quadrant. There is no free air. Mild generalized mesenteric edema. Musculoskeletal: Chronic bilateral L5 pars interarticularis defects with grade 1 anterolisthesis of L5 on S1 and near complete L5-S1 disc space loss. Additional degenerative change throughout the lumbar spine left hip arthroplasty. IMPRESSION: 1. Findings consistent with gallstone ileus/small bowel obstruction. Dilated fluid-filled stomach and small bowel with transition point in the distal ileum where there is a 2.1 cm lamellated calcification (presumed gallstone) in the distal ileum. Loss of fat plane between the gallbladder and duodenum suspicious for choleduodenal fistula. 2. Air within the non dependent duodenum and proximal small bowel is presumably intraluminal rather than pneumatosis. No free air. 3. Small volume free fluid/ascites within the pelvis and right upper quadrant. 4. Clustered micro nodules in the dependent right lower lobe, likely infectious or inflammatory. 5. Sigmoid colonic diverticulosis without diverticulitis. 6. Chronic bilateral L5 pars interarticularis defects with grade 1 anterolisthesis of L5 on S1 and near complete L5-S1 disc space loss. Aortic Atherosclerosis (ICD10-I70.0). Electronically Signed   By: Keith Rake M.D.   On: 02/22/2021 17:50   DG Abdomen 1 View  Result Date: 02/22/2021 CLINICAL DATA:  NG tube placement EXAM: ABDOMEN - 1 VIEW COMPARISON:  CT 02/22/2021 FINDINGS:  Esophageal tube tip overlies the stomach, side-port in the region of GE junction. Multiple air distended small bowel loops consistent with a bowel obstruction IMPRESSION: Esophageal tube side-port in the region of GE junction, consider further advancement for more optimal positioning. Multiple dilated loops of small bowel consistent with a bowel obstruction. Electronically Signed   By: Donavan Foil M.D.   On: 02/22/2021 19:41    Anti-infectives: Anti-infectives (From admission, onward)    Start     Dose/Rate Route Frequency Ordered Stop   02/23/21 0845  ciprofloxacin (CIPRO) IVPB 400 mg        400 mg 200 mL/hr over 60 Minutes Intravenous  Once 02/23/21 0745 02/23/21 1041   02/23/21 0845  metroNIDAZOLE (FLAGYL) IVPB 500 mg        500 mg 100 mL/hr over 60 Minutes Intravenous  Once 02/23/21 0745 02/23/21 1124        Assessment/Plan Gallstone ileus with SBO POD1 s/p  exploratory laparotomy, small bowel resection with anastomosis by Dr. Kieth Brightly 1/9 - NGT 425 ml/24H. Ambulate and monitor bowel function. 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 TOV today   Per primary: Pulm micronodules Mild polycythemia HTN CKD stage IIIb  LOS: 1 day   [facility] 02/24/2021, 9:03 AM Please see Amion for pager number during day hours 7:00am-4:30pm

## 2021-02-24 NOTE — Discharge Instructions (Addendum)
Jacqueline Riggs, [place] [id number]  OPEN ABDOMINAL Riggs: POST OP INSTRUCTIONS  Always review your discharge instruction sheet given to you by the facility where your Riggs was performed.  IF YOU HAVE DISABILITY OR FAMILY LEAVE FORMS, YOU MUST BRING THEM TO THE OFFICE FOR PROCESSING.  PLEASE DO NOT GIVE THEM TO YOUR DOCTOR.  A prescription for pain medication may be given to you upon discharge.  Take your pain medication as prescribed, if needed.  If narcotic pain medicine is not needed, then you may take acetaminophen (Tylenol) or ibuprofen (Advil) as needed. Take your usually prescribed medications unless otherwise directed. If you need a refill on your pain medication, please contact your pharmacy. They will contact our office to request authorization.  Prescriptions will not be filled after 5pm or on week-ends. You should follow a light diet the first few days after arrival home, such as soup and crackers, pudding, etc.unless your doctor has advised otherwise. A high-fiber, low fat diet can be resumed as tolerated.   Be sure to include lots of fluids daily. Most patients will experience some swelling and bruising on the chest and neck area.  Ice packs will help.  Swelling and bruising can take several days to resolve Most patients will experience some swelling and bruising in the area of the incision. Ice pack will help. Swelling and bruising can take several days to resolve..  It is common to experience some constipation if taking pain medication after Riggs.  Increasing fluid intake and taking a stool softener will usually help or prevent this problem from occurring.  A mild laxative (Milk of Magnesia or Miralax) should be taken according to package directions if there are no bowel movements after 48 hours.  You may have steri-strips (small skin tapes) in place directly over the incision.  These strips should be left on the skin for 7-10 days.  If your surgeon used skin  glue on the incision, you may shower in 24 hours.  The glue will flake off over the next 2-3 weeks.  Any sutures or staples will be removed at the office during your follow-up visit. You may find that a light gauze bandage over your incision may keep your staples from being rubbed or pulled. You may shower and replace the bandage daily. ACTIVITIES:  You may resume regular (light) daily activities beginning the next day--such as daily self-care, walking, climbing stairs--gradually increasing activities as tolerated.  You may have sexual intercourse when it is comfortable.  Refrain from any heavy lifting or straining until approved by your doctor - nothing greater than 15 pounds for 6 weeks You may drive when you no longer are taking prescription pain medication, you can comfortably wear a seatbelt, and you can safely maneuver your car and apply brakes  You should see your doctor in the office for a follow-up appointment approximately two weeks after your Riggs.  Make sure that you call for this appointment within a day or two after you arrive home to insure a convenient appointment time.  WHEN TO CALL YOUR DOCTOR: Fever over 101.0 Inability to urinate Nausea and/or vomiting Extreme swelling or bruising Continued bleeding from incision. Increased pain, redness, or drainage from the incision. Difficulty swallowing or breathing Muscle cramping or spasms. Numbness or tingling in hands or feet or around lips.  The clinic staff is available to answer your questions during regular business hours.  Please dont hesitate to call and ask to speak to one  of the nurses if you have concerns.  For further questions, please visit [email or web address]    Managing Your Pain After Riggs Without Opioids    Thank you for participating in our program to help patients manage their pain after Riggs without opioids. This is part of our effort to provide you with the best care possible, without  exposing you or your family to the risk that opioids pose.  What pain can I expect after Riggs? You can expect to have some pain after Riggs. This is normal. The pain is typically worse the day after Riggs, and quickly begins to get better. Many studies have found that many patients are able to manage their pain after Riggs with Over-the-Counter (OTC) medications such as Tylenol and Motrin. If you have a condition that does not allow you to take Tylenol or Motrin, notify your surgical team.  How will I manage my pain? The best strategy for controlling your pain after Riggs is around the clock pain control with Tylenol (acetaminophen) and Motrin (ibuprofen or Advil). Alternating these medications with each other allows you to maximize your pain control. In addition to Tylenol and Motrin, you can use heating pads or ice packs on your incisions to help reduce your pain.  How will I alternate your regular strength over-the-counter pain medication? You will take a dose of pain medication every three hours. Start by taking 650 mg of Tylenol (2 pills of 325 mg) 3 hours later take 600 mg of Motrin (3 pills of 200 mg) 3 hours after taking the Motrin take 650 mg of Tylenol 3 hours after that take 600 mg of Motrin.   - 1 -  See example - if your first dose of Tylenol is at 12:00 PM   12:00 PM Tylenol 650 mg (2 pills of 325 mg)  3:00 PM Motrin 600 mg (3 pills of 200 mg)  6:00 PM Tylenol 650 mg (2 pills of 325 mg)  9:00 PM Motrin 600 mg (3 pills of 200 mg)  Continue alternating every 3 hours   We recommend that you follow this schedule around-the-clock for at least 3 days after Riggs, or until you feel that it is no longer needed. Use the table on the last page of this handout to keep track of the medications you are taking. Important: Do not take more than 3000mg  of Tylenol or 3200mg  of Motrin in a 24-hour period. Do not take ibuprofen/Motrin if you have a history of bleeding stomach  ulcers, severe kidney disease, &/or actively taking a blood thinner  What if I still have pain? If you have pain that is not controlled with the over-the-counter pain medications (Tylenol and Motrin or Advil) you might have what we call breakthrough pain. You will receive a prescription for a small amount of an opioid pain medication such as Oxycodone, Tramadol, or Tylenol with Codeine. Use these opioid pills in the first 24 hours after Riggs if you have breakthrough pain. Do not take more than 1 pill every 4-6 hours.  If you still have uncontrolled pain after using all opioid pills, don't hesitate to call our staff using the number provided. We will help make sure you are managing your pain in the best way possible, and if necessary, we can provide a prescription for additional pain medication.   Day 1    Time  Name of Medication Number of pills taken  Amount of Acetaminophen  Pain Level   Comments  AM PM  AM PM       AM PM       AM PM       AM PM       AM PM       AM PM       AM PM       Total Daily amount of Acetaminophen Do not take more than  3,000 mg per day      Day 2    Time  Name of Medication Number of pills taken  Amount of Acetaminophen  Pain Level   Comments  AM PM       AM PM       AM PM       AM PM       AM PM       AM PM       AM PM       AM PM       Total Daily amount of Acetaminophen Do not take more than  3,000 mg per day      Day 3    Time  Name of Medication Number of pills taken  Amount of Acetaminophen  Pain Level   Comments  AM PM       AM PM       AM PM       AM PM          AM PM       AM PM       AM PM       AM PM       Total Daily amount of Acetaminophen Do not take more than  3,000 mg per day      Day 4    Time  Name of Medication Number of pills taken  Amount of Acetaminophen  Pain Level   Comments  AM PM       AM PM       AM PM       AM PM       AM PM       AM PM       AM PM       AM PM        Total Daily amount of Acetaminophen Do not take more than  3,000 mg per day      Day 5    Time  Name of Medication Number of pills taken  Amount of Acetaminophen  Pain Level   Comments  AM PM       AM PM       AM PM       AM PM       AM PM       AM PM       AM PM       AM PM       Total Daily amount of Acetaminophen Do not take more than  3,000 mg per day       Day 6    Time  Name of Medication Number of pills taken  Amount of Acetaminophen  Pain Level  Comments  AM PM       AM PM       AM PM       AM PM       AM PM       AM PM       AM PM       AM PM  of Acetaminophen °Do not take more than  3,000 mg per day    ° ° °Day 7   ° °Time  °Name of Medication Number of pills taken  °Amount of Acetaminophen  °Pain Level  ° °Comments  °AM PM       °AM PM       °AM PM       °AM PM       °AM PM       °AM PM       °AM PM       °AM PM       °Total Daily amount of Acetaminophen °Do not take more than  3,000 mg per day    ° ° ° ° °For additional information about how and where to safely dispose of unused opioid °medications - https://www.morepowerfulnc.org ° °Disclaimer: This document contains information and/or instructional materials adapted from [facility] for the typical patient with your condition. It does not replace medical advice from your health care provider because your experience may differ from that of the °typical patient. Talk to your health care provider if you have any questions about this °document, your condition or your treatment plan. °Adapted from [facility] ° ° °

## 2021-02-24 NOTE — Plan of Care (Signed)
Pt aox4, pleasant and cooperative.  POD#1. NGT intact, LIS. NPO Status.  IVF and pain regimen per orders.  Weaned off O2, plans for ambulation and removal of foley today.  Spouse at bedside.  Problem: Education: Goal: Knowledge of General Education information will improve Description: Including pain rating scale, medication(s)/side effects and non-pharmacologic comfort measures Outcome: Progressing   Problem: Health Behavior/Discharge Planning: Goal: Ability to manage health-related needs will improve Outcome: Progressing   Problem: Clinical Measurements: Goal: Ability to maintain clinical measurements within normal limits will improve Outcome: Progressing Goal: Will remain free from infection Outcome: Progressing Goal: Diagnostic test results will improve Outcome: Progressing Goal: Respiratory complications will improve Outcome: Progressing Goal: Cardiovascular complication will be avoided Outcome: Progressing   Problem: Activity: Goal: Risk for activity intolerance will decrease Outcome: Progressing   Problem: Nutrition: Goal: Adequate nutrition will be maintained Outcome: Progressing   Problem: Coping: Goal: Level of anxiety will decrease Outcome: Progressing   Problem: Elimination: Goal: Will not experience complications related to bowel motility Outcome: Progressing Goal: Will not experience complications related to urinary retention Outcome: Progressing   Problem: Pain Managment: Goal: General experience of comfort will improve Outcome: Progressing   Problem: Safety: Goal: Ability to remain free from injury will improve Outcome: Progressing   Problem: Skin Integrity: Goal: Risk for impaired skin integrity will decrease Outcome: Progressing

## 2021-02-24 NOTE — Progress Notes (Signed)
Occupational Therapy Evaluation  Patient lives at home with spouse in two story house, and is independent at baseline with self care tasks. Uses a cane occasionally for ambulation. Currently patient needing increased assistance with lower body self care tasks due to post op abdominal pain. Educate patient on abdominal precautions and long handle AE which patient is familiar with due to history of knee and hip replacements. Please see below for D/C recommendations, OT to follow during acute hospitalization.    02/24/21 1400  OT Visit Information  Last OT Received On 02/24/21  Assistance Needed +1  History of Present Illness 66 y.o. female with s/p exploratory laparotomy, small bowel resection with anastomosis on 02/24/20. PMH: CKD,  hypertension, hyperlipidemia, CKD stage III, GERD, OSA, L DA THA, R TSA, depression presented to ED complaining of abdominal pain and distention, general surgery consulted.  Precautions  Precaution Comments NGT  Restrictions  Weight Bearing Restrictions No  Home Living  Family/patient expects to be discharged to: Private residence  Living Arrangements Spouse/significant other  Available Help at Discharge Available 24 hours/day;Family  Type of [place] to enter  Entrance Stairs-Number of Steps 3  [place] Two level  Bathroom Shower/Tub Tub/shower unit;[place] - single point  Additional Comments husband works 11p to 7a; he can be available as much as needed. Patient reports she lent out DME 3N1, walker and rollator but could get them back in needed at D/C  Prior Function  Prior Level of Function  Independent/Modified Independent  Mobility Comments uses cane occasionally  ADLs Comments independent ADLs, she and her husband share cooking, he will be available to assist with laudry/heavier household tasks  Communication  Communication No difficulties  Pain Assessment  Pain Assessment 0-10  Pain Score 1  Pain  Location abd  Pain Descriptors / Indicators Sore  Pain Intervention(s) Premedicated before session  Cognition  Arousal/Alertness Awake/alert  Behavior During Therapy WFL for tasks assessed/performed  Overall Cognitive Status Within Functional Limits for tasks assessed  Upper Extremity Assessment  Upper Extremity Assessment Overall WFL for tasks assessed  Lower Extremity Assessment  Lower Extremity Assessment Defer to PT evaluation  ADL  Overall ADL's  Needs assistance/impaired  Eating/Feeding NPO  Grooming Set up;Sitting  Upper Body Bathing Set up;Sitting  Lower Body Bathing Sitting/lateral leans;Sit to/from stand;Moderate assistance  Upper Body Dressing  Set up;Sitting  Lower Body Dressing Maximal assistance;Sitting/lateral leans;Sit to/from stand  Lower Body Dressing Details (indicate cue type and reason) Attempts to doff socks however due to abdominal pain unable. Patient reports aware how to use LH AE from previous hip/knee surgeries  Toilet Transfer Supervision/safety  Toilet Transfer Details (indicate cue type and reason) Patient able to complete sit to stand and take side steps to head of bed without physical assist or use of AD  Toileting- Clothing Manipulation and Hygiene Supervision/safety  Functional mobility during ADLs Supervision/safety  General ADL Comments Patient needing increased assistance with lower body ADLs due to post op pain.  Bed Mobility  Overal bed mobility Needs Assistance  Bed Mobility Rolling;Sidelying to Sit;Sit to Sidelying  Rolling Supervision  Sidelying to sit Supervision  Sit to sidelying Supervision  General bed mobility comments Familiar with log roll technique. Needs increased time and supervision for line management  Balance  Overall balance assessment Mild deficits observed, not formally tested  OT - End of Session  Activity Tolerance Patient tolerated treatment well  Patient left in bed;with family/visitor present;with call bell/phone  within reach  Nurse Communication Mobility status  OT Assessment  OT Recommendation/Assessment Patient needs continued OT Services  OT Visit Diagnosis Pain  Pain - part of body  (abdomen)  OT Problem List Pain;Obesity;Decreased activity tolerance  OT Plan  OT Frequency (ACUTE ONLY) Min 2X/week  OT Treatment/Interventions (ACUTE ONLY) Self-care/ADL training;Therapeutic activities;Patient/family education;Balance training;DME and/or AE instruction  AM-PAC OT "6 Clicks" Daily Activity Outcome Measure (Version 2)  Help from another person eating meals? 1 (NPO)  Help from another person taking care of personal grooming? 3  Help from another person toileting, which includes using toliet, bedpan, or urinal? 3  Help from another person bathing (including washing, rinsing, drying)? 2  Help from another person to put on and taking off regular upper body clothing? 3  Help from another person to put on and taking off regular lower body clothing? 2  6 Click Score 14  Progressive Mobility  What is the highest level of mobility based on the progressive mobility assessment? Level 4 (Walks with assist in room) - Balance while marching in place and cannot step forward and back - Complete  Mobility Sit up in bed/chair position for meals  OT Recommendation  Follow Up Recommendations No OT follow up  Assistance recommended at discharge PRN  Patient can return home with the following A little help with bathing/dressing/bathroom  Functional Status Assessent Patient has had a recent decline in their functional status and demonstrates the ability to make significant improvements in function in a reasonable and predictable amount of time.  OT Equipment None recommended by OT  Individuals Consulted  Consulted and Agree with Results and Recommendations Patient  Acute Rehab OT Goals  Patient Stated Goal home  OT Goal Formulation With patient  Time For Goal Achievement 03/10/21  Potential to Achieve Goals Good   OT Time Calculation  OT Start Time (ACUTE ONLY) 1340  OT Stop Time (ACUTE ONLY) 1355  OT Time Calculation (min) 15 min  OT General Charges  $OT Visit 1 Visit  OT Evaluation  $OT Eval Low Complexity 1 Low  Written Expression  Dominant Hand  (did not specify)   Delbert Phenix OT OT pager: [phone]

## 2021-02-24 NOTE — Evaluation (Signed)
Physical Therapy Evaluation Patient Details Name: Jacqueline Riggs MRN: [id number] DOB: [birth year] Today's Date: 02/24/2021  History of Present Illness  66 y.o. female with PMH: CKD,  hypertension, hyperlipidemia, CKD stage III, GERD, OSA, L DA THA, R TSA, depression presented to ED complaining of abdominal pain and distention. general surgery consulted. s/p exploratory laparotomy, small bowel resection with anastomosis on 02/24/20  Clinical Impression  Pt admitted with above diagnosis.  Pt familiar to this PT from previous admissions. Jacqueline Riggs is motivated to work with PT however feeling very weak and with some dizziness with EOB. Able to take some steps to chair with RW and min/guard. Anticipate she will progress well. Husband present and supportive  Pt currently with functional limitations due to the deficits listed below (see PT Problem List). Pt will benefit from skilled PT to increase their independence and safety with mobility to allow discharge to the venue listed below.          Recommendations for follow up therapy are one component of a multi-disciplinary discharge planning process, led by the attending physician.  Recommendations may be updated based on patient status, additional functional criteria and insurance authorization.  Follow Up Recommendations No PT follow up    Assistance Recommended at Discharge Intermittent Supervision/Assistance  Patient can return home with the following  Assistance with cooking/housework;Help with stairs or ramp for entrance    Equipment Recommendations None recommended by PT  Recommendations for Other Services       Functional Status Assessment Patient has had a recent decline in their functional status and demonstrates the ability to make significant improvements in function in a reasonable and predictable amount of time.     Precautions / Restrictions Precautions Precaution Comments: NGT Restrictions Weight Bearing Restrictions: No       Mobility  Bed Mobility Overal bed mobility: Needs Assistance Bed Mobility: Rolling;Sidelying to Sit Rolling: Min assist Sidelying to sit: Min assist       General bed mobility comments: cues for log roll technique, incr time, min assist to elevate trunk    Transfers Overall transfer level: Needs assistance Equipment used: Rolling walker (2 wheels)               General transfer comment: cues to power up with LEs    Ambulation/Gait Ambulation/Gait assistance: Min assist Gait Distance (Feet): 5 Feet Assistive device: Rolling walker (2 wheels) Gait Pattern/deviations: Step-through pattern       General Gait Details: cues for posture, trunk extension as possible  Stairs            Wheelchair Mobility    Modified Rankin (Stroke Patients Only)       Balance                                             Pertinent Vitals/Pain Pain Assessment: Faces Faces Pain Scale: Hurts little more Pain Location: abd Pain Descriptors / Indicators: Grimacing;Sore Pain Intervention(s): Monitored during session;Limited activity within patient's tolerance;Repositioned    Home Living Family/patient expects to be discharged to:: Private residence Living Arrangements: Spouse/significant other Available Help at Discharge: Available 24 hours/day;Family Type of Home: House Home Access: Stairs to enter   [employer] of Steps: 3   Home Layout: Two level Home Equipment: Cane - single point Additional Comments: husband works 11p to 7a; he can be available as much as needed  Prior Function Prior Level of Function : Independent/Modified Independent             Mobility Comments: uses cane occasionally ADLs Comments: independent ADLs, she and her husband share cooking, he will be available to assist with laudry/heavier household tasks     Hand Dominance        Extremity/Trunk Assessment   Upper Extremity Assessment Upper Extremity  Assessment: Overall WFL for tasks assessed    Lower Extremity Assessment Lower Extremity Assessment: Overall WFL for tasks assessed       Communication      Cognition Arousal/Alertness: Awake/alert Behavior During Therapy: WFL for tasks assessed/performed Overall Cognitive Status: Within Functional Limits for tasks assessed                                          General Comments      Exercises     Assessment/Plan    PT Assessment Patient needs continued PT services  PT Problem List Decreased strength;Decreased range of motion;Decreased mobility;Decreased activity tolerance;Decreased balance;Pain;Decreased knowledge of use of DME       PT Treatment Interventions DME instruction;Therapeutic activities;Gait training;Functional mobility training;Therapeutic exercise;Patient/family education    PT Goals (Current goals can be found in the Care Plan section)  Acute Rehab PT Goals Patient Stated Goal: feel better,get rid of NGT PT Goal Formulation: With patient Time For Goal Achievement: 03/10/21 Potential to Achieve Goals: Good    Frequency Min 3X/week     Co-evaluation               AM-PAC PT "6 Clicks" Mobility  Outcome Measure Help needed turning from your back to your side while in a flat bed without using bedrails?: A Little Help needed moving from lying on your back to sitting on the side of a flat bed without using bedrails?: A Little Help needed moving to and from a bed to a chair (including a wheelchair)?: A Little Help needed standing up from a chair using your arms (e.g., wheelchair or bedside chair)?: A Little Help needed to walk in hospital room?: A Little Help needed climbing 3-5 steps with a railing? : A Lot 6 Click Score: 17    End of Session   Activity Tolerance: Patient tolerated treatment well Patient left: with call bell/phone within reach;in chair;with chair alarm set;with nursing/sitter in room Nurse Communication:  Mobility status PT Visit Diagnosis: Other abnormalities of gait and mobility (R26.89)    Time: 1131-1150 PT Time Calculation (min) (ACUTE ONLY): 19 min   Charges:   PT Evaluation $PT Eval Low Complexity: [place], PT  Acute Rehab Dept ([place]) [phone] Pager [phone]  02/24/2021   [facility] 02/24/2021, 11:52 AM

## 2021-02-25 DIAGNOSIS — E876 Hypokalemia: Secondary | ICD-10-CM

## 2021-02-25 LAB — CBC
HCT: 44.2 % (ref 36.0–46.0)
Hemoglobin: 14.2 g/dL (ref 12.0–15.0)
MCH: 30.3 pg (ref 26.0–34.0)
MCHC: 32.1 g/dL (ref 30.0–36.0)
MCV: 94.2 fL (ref 80.0–100.0)
Platelets: 212 10*3/uL (ref 150–400)
RBC: 4.69 MIL/uL (ref 3.87–5.11)
RDW: 14.3 % (ref 11.5–15.5)
WBC: 12.4 10*3/uL — ABNORMAL HIGH (ref 4.0–10.5)
nRBC: 0 % (ref 0.0–0.2)

## 2021-02-25 LAB — COMPREHENSIVE METABOLIC PANEL
ALT: 16 U/L (ref 0–44)
AST: 21 U/L (ref 15–41)
Albumin: 3 g/dL — ABNORMAL LOW (ref 3.5–5.0)
Alkaline Phosphatase: 47 U/L (ref 38–126)
Anion gap: 10 (ref 5–15)
BUN: 24 mg/dL — ABNORMAL HIGH (ref 8–23)
CO2: 24 mmol/L (ref 22–32)
Calcium: 8.1 mg/dL — ABNORMAL LOW (ref 8.9–10.3)
Chloride: 105 mmol/L (ref 98–111)
Creatinine, Ser: 0.91 mg/dL (ref 0.44–1.00)
GFR, Estimated: >60 mL/min (ref >60)
Glucose, Bld: 84 mg/dL (ref 70–99)
Potassium: 3.4 mmol/L — ABNORMAL LOW (ref 3.5–5.1)
Sodium: 139 mmol/L (ref 135–145)
Total Bilirubin: 1 mg/dL (ref 0.3–1.2)
Total Protein: 6.4 g/dL — ABNORMAL LOW (ref 6.5–8.1)

## 2021-02-25 LAB — MAGNESIUM: Magnesium: 2.3 mg/dL (ref 1.7–2.4)

## 2021-02-25 MED ORDER — POTASSIUM CHLORIDE 10 MEQ/100ML IV SOLN
10.0000 meq | INTRAVENOUS | Status: AC
  Administered 2021-02-25 (×4): 10 meq via INTRAVENOUS
  Filled 2021-02-25 (×4): qty 100

## 2021-02-25 NOTE — Progress Notes (Signed)
°   02/25/21 1400  Mobility  Activity Ambulated in hall  Level of Assistance Standby assist, set-up cues, supervision of patient - no hands on  Assistive Device Other (Comment) (IV pole)  Distance Ambulated (ft) 160 ft  Mobility Ambulated with assistance in hallway  Mobility Response Tolerated well  Mobility performed by Mobility specialist  $Mobility charge 1 Mobility   Pt agreeable to mobilizing this afternoon. NG tube clamped prior to session. Ambulated about 14ft in hall with IV pole, tolerated well. Left pt in chair with call bell at side. Pt requested some ice to place on her stomach. Grabbed pt ice and notified RN of session.  [place] [employer] Mobility Specialist Acute Rehab Services Office: [phone]

## 2021-02-25 NOTE — Progress Notes (Signed)
But no PROGRESS NOTE  Jacqueline Riggs [id number] DOB: 22-Nov-1955 DOA: 02/22/2021 PCP: Aura Dials, MD   LOS: 2 days   Brief narrative:  Jacqueline Riggs is a 66 y.o. female with past medical history of hypertension, hyperlipidemia, CKD stage III, GERD, obstructive sleep apnea, depression presented to hospital with abdominal pain and distention.  In the ED, patient was noted to be tachycardic and febrile with elevated blood pressure.  Labs showed WBC at 13.8 with creatinine of 1.6 with baseline 1.4 in May 2020.  CT scan of the abdomen and pelvis showed gallstone ileus and small bowel obstruction.   Of note, patient had colonoscopy on December 8th with a previous history of total abdominal hysterectomy.  General surgery was consulted from the ED and NG tube was placed and patient was then admitted to the hospital for further evaluation and treatment.  Assessment/Plan:  Principal Problem:   Gallstone ileus ([place]) Active Problems:   SBO (small bowel obstruction) ([place])   Essential hypertension   Hyperlipidemia   CKD (chronic kidney disease), stage III ([place])  Gallstone ileus with small bowel obstruction. Status post exploratory laparotomy with small bowel resection and anastomosis by Dr. Kieth Brightly on 02/23/2021.  Currently on NG tube , could clamp and try clears as per surgery.  Continue PT OT incentive spirometry and out of bed to chair.  Encouraged deep breathing.  Further management as per surgical team  Pulmonary micronodules CT scan showing clustered micronodules.  History of smoking 10 years ago.  Procalcitonin 0.1.  Might benefit from dedicated chest CT for further evaluation after resolution of gallstone ileus/SBO.   Mild polycythemia Latest hemoglobin of 14.2.  Received IV fluid during hospitalization.  Hypokalemia.  We will continue to replenish.  Check levels in a.m.  Hypertension Continue IV labetalol prn SBP >170, added as needed hydralazine    Acute kidney injury on CKD stage  IIIb Creatinine of 0.9.  Baseline creatinine of around 1.4 in May 2020.  Improved at this time.  DVT prophylaxis: SCDs Start: 02/23/21 [id number]   Code Status: Full code  Family Communication:  Spoke with the patient at bedside  Status is: Inpatient  Remains inpatient appropriate because: Status post surgical intervention postoperative day 2,   Consultants: General surgery  Procedures: NG tube placement Exploratory laparotomy with bowel resection and anastomosis on 02/23/2021.  Anti-infectives:  None now  Anti-infectives (From admission, onward)    Start     Dose/Rate Route Frequency Ordered Stop   02/23/21 0845  ciprofloxacin (CIPRO) IVPB 400 mg        400 mg 200 mL/hr over 60 Minutes Intravenous  Once 02/23/21 0745 02/23/21 1041   02/23/21 0845  metroNIDAZOLE (FLAGYL) IVPB 500 mg        500 mg 100 mL/hr over 60 Minutes Intravenous  Once 02/23/21 0745 02/23/21 1124      Subjective: Today, patient was seen and examined at bedside.  Patient has been passing some gas.  Complains of mild sore in the abdomen.  Still has NG tube in place.  No nausea or vomiting.  No fever chills or shortness of breath.    Objective: Vitals:   02/25/21 0403 02/25/21 0700  BP: (!) 167/97 (!) 149/85  Pulse: (!) 102 92  Resp: 18   Temp: 98.6 F (37 C)   SpO2: 95%     Intake/Output Summary (Last 24 hours) at 02/25/2021 1144 Last data filed at 02/25/2021 0906 Gross per 24 hour  Intake 1317.4 ml  Output 1650  ml  Net -332.6 ml    Filed Weights   02/22/21 1558  Weight: 89.4 kg   Body mass index is 36.03 kg/m.   Physical Exam: General: Obese built, not in obvious distress, NG tube in place HENT:   No scleral pallor or icterus noted. Oral mucosa is moist.  NG tube in place with dark fluid Chest:    Diminished breath sounds bilaterally. No crackles or wheezes.  CVS: S1 &S2 heard. No murmur.  Regular rate and rhythm. Abdomen: Soft, status post laparoscopic surgery with dressing.   Nontender, nondistended.  Bowel sounds are heard.   Extremities: No cyanosis, clubbing or edema.  Peripheral pulses are palpable. Psych: Alert, awake and oriented, normal mood CNS:  No cranial nerve deficits.  Power equal in all extremities.   Skin: Warm and dry.  No rashes noted.  Data Review: I have personally reviewed the following laboratory data and studies,  CBC: Recent Labs  Lab 02/22/21 1607 02/23/21 0715 02/24/21 0439 02/25/21 0437  WBC 10.8* 7.4 12.4* 12.4*  HGB 16.9* 16.1* 16.3* 14.2  HCT 52.4* 48.8* 49.0* 44.2  MCV 90.0 90.5 89.7 94.2  PLT 299 294 277 [id number]    Basic Metabolic Panel: Recent Labs  Lab 02/22/21 1607 02/23/21 0715 02/24/21 0439 02/25/21 0437  NA 137 138 135 139  K 3.8 3.6 3.9 3.4*  CL 98 102 100 105  CO2 24 24 25 24   GLUCOSE 150* 109* 125* 84  BUN 20 31* 20 24*  CREATININE 1.67* 1.74* 1.11* 0.91  CALCIUM 10.2 9.5 8.4* 8.1*  MG  --  2.2 2.2 2.3  PHOS  --   --  2.6  --     Liver Function Tests: Recent Labs  Lab 02/22/21 1607 02/24/21 0439 02/25/21 0437  AST 18 27 21   ALT 14 17 16   ALKPHOS 69 54 47  BILITOT 0.8 1.8* 1.0  PROT 7.8 7.1 6.4*  ALBUMIN 4.4 3.3* 3.0*    Recent Labs  Lab 02/22/21 1607  LIPASE <10*    No results for input(s): AMMONIA in the last 168 hours. Cardiac Enzymes: No results for input(s): CKTOTAL, CKMB, CKMBINDEX, TROPONINI in the last 168 hours. BNP (last 3 results) No results for input(s): BNP in the last 8760 hours.  ProBNP (last 3 results) No results for input(s): PROBNP in the last 8760 hours.  CBG: No results for input(s): GLUCAP in the last 168 hours. Recent Results (from the past 240 hour(s))  Resp Panel by RT-PCR (Flu A&B, Covid) Nasopharyngeal Swab     Status: None   Collection Time: 02/22/21  4:01 PM   Specimen: Nasopharyngeal Swab; Nasopharyngeal(NP) swabs in vial transport medium  Result Value Ref Range Status   SARS Coronavirus 2 by RT PCR NEGATIVE NEGATIVE Final    Comment:  (NOTE) SARS-CoV-2 target nucleic acids are NOT DETECTED.  The SARS-CoV-2 RNA is generally detectable in upper respiratory specimens during the acute phase of infection. The lowest concentration of SARS-CoV-2 viral copies this assay can detect is 138 copies/mL. A negative result does not preclude SARS-Cov-2 infection and should not be used as the sole basis for treatment or other patient management decisions. A negative result may occur with  improper specimen collection/handling, submission of specimen other than nasopharyngeal swab, presence of viral mutation(s) within the areas targeted by this assay, and inadequate number of viral copies(<138 copies/mL). A negative result must be combined with clinical observations, patient history, and epidemiological information. The expected result is Negative.  Fact Sheet for  Patients:  EntrepreneurPulse.com.au  Fact Sheet for Healthcare Providers:  IncredibleEmployment.be  This test is no t yet approved or cleared by the [place] FDA and  has been authorized for detection and/or diagnosis of SARS-CoV-2 by FDA under an Emergency Use Authorization (EUA). This EUA will remain  in effect (meaning this test can be used) for the duration of the COVID-19 declaration under Section 564(b)(1) of the Act, 21 U.S.C.section 360bbb-3(b)(1), unless the authorization is terminated  or revoked sooner.       Influenza A by PCR NEGATIVE NEGATIVE Final   Influenza B by PCR NEGATIVE NEGATIVE Final    Comment: (NOTE) The Xpert Xpress SARS-CoV-2/FLU/RSV plus assay is intended as an aid in the diagnosis of influenza from Nasopharyngeal swab specimens and should not be used as a sole basis for treatment. Nasal washings and aspirates are unacceptable for Xpert Xpress SARS-CoV-2/FLU/RSV testing.  Fact Sheet for Patients: EntrepreneurPulse.com.au  Fact Sheet for Healthcare  Providers: IncredibleEmployment.be  This test is not yet approved or cleared by the [place] FDA and has been authorized for detection and/or diagnosis of SARS-CoV-2 by FDA under an Emergency Use Authorization (EUA). This EUA will remain in effect (meaning this test can be used) for the duration of the COVID-19 declaration under Section 564(b)(1) of the Act, 21 U.S.C. section 360bbb-3(b)(1), unless the authorization is terminated or revoked.  Performed at [facility], [address], [place], [place] [address]       Studies: No results found.   Flora Lipps, MD  [facility] 02/25/2021  If 7PM-7AM, please contact night-coverage

## 2021-02-25 NOTE — Plan of Care (Signed)
No acute events overnight.  Problem: Education: Goal: Knowledge of General Education information will improve Description: Including pain rating scale, medication(s)/side effects and non-pharmacologic comfort measures Outcome: Progressing   Problem: Health Behavior/Discharge Planning: Goal: Ability to manage health-related needs will improve Outcome: Progressing   Problem: Clinical Measurements: Goal: Ability to maintain clinical measurements within normal limits will improve Outcome: Progressing Goal: Will remain free from infection Outcome: Progressing Goal: Diagnostic test results will improve Outcome: Progressing Goal: Respiratory complications will improve Outcome: Progressing Goal: Cardiovascular complication will be avoided Outcome: Progressing   Problem: Activity: Goal: Risk for activity intolerance will decrease Outcome: Progressing   Problem: Coping: Goal: Level of anxiety will decrease Outcome: Progressing   Problem: Elimination: Goal: Will not experience complications related to urinary retention Outcome: Progressing   Problem: Pain Managment: Goal: General experience of comfort will improve Outcome: Progressing   Problem: Safety: Goal: Ability to remain free from injury will improve Outcome: Progressing   Problem: Skin Integrity: Goal: Risk for impaired skin integrity will decrease Outcome: Progressing

## 2021-02-25 NOTE — Clinical Social Work Note (Signed)
°  Transition of Care [facility]) Screening Note   Patient Details  Name: Jacqueline Riggs Date of Birth: 07-13-1955   Transition of Care [facility]) CM/SW Contact:    Trish Mage, LCSW Phone Number: 02/25/2021, 8:46 AM    Transition of Care Department [facility]) has reviewed patient and no TOC needs have been identified at this time. We will continue to monitor patient advancement through interdisciplinary progression rounds. If new patient transition needs arise, please place a TOC consult.

## 2021-02-25 NOTE — Progress Notes (Signed)
°  2 Days Post-Op   Chief Complaint/Subjective: +flatus, moderate pain from incisions, no nausea or vomiting  Objective: Vital signs in last 24 hours: Temp:  [98.5 F (36.9 C)-98.6 F (37 C)] 98.6 F (37 C) (01/11 0403) Pulse Rate:  [92-102] 92 (01/11 0700) Resp:  [12-18] 18 (01/11 0403) BP: (149-167)/(85-97) 149/85 (01/11 0700) SpO2:  [95 %-96 %] 95 % (01/11 0403) Last BM Date: 02/20/21 Intake/Output from previous day: 01/10 0701 - 01/11 0700 In: 977.4 [I.V.:757.4; NG/GT:20; IV Piggyback:200] Out: [id number] [Urine:450; Emesis/NG output:1200] Intake/Output this shift: No intake/output data recorded.  PE: Gen: NAd Resp: nonlabored Card: RRR Abd: soft, incision intact, nondistended  Lab Results:  Recent Labs    02/24/21 0439 02/25/21 0437  WBC 12.4* 12.4*  HGB 16.3* 14.2  HCT 49.0* 44.2  PLT 277 212   BMET Recent Labs    02/24/21 0439 02/25/21 0437  NA 135 139  K 3.9 3.4*  CL 100 105  CO2 25 24  GLUCOSE 125* 84  BUN 20 24*  CREATININE 1.11* 0.91  CALCIUM 8.4* 8.1*   PT/INR No results for input(s): LABPROT, INR in the last 72 hours. CMP     Component Value Date/Time   NA 139 02/25/2021 0437   K 3.4 (L) 02/25/2021 0437   CL 105 02/25/2021 0437   CO2 24 02/25/2021 0437   GLUCOSE 84 02/25/2021 0437   BUN 24 (H) 02/25/2021 0437   CREATININE 0.91 02/25/2021 0437   CALCIUM 8.1 (L) 02/25/2021 0437   PROT 6.4 (L) 02/25/2021 0437   ALBUMIN 3.0 (L) 02/25/2021 0437   AST 21 02/25/2021 0437   ALT 16 02/25/2021 0437   ALKPHOS 47 02/25/2021 0437   BILITOT 1.0 02/25/2021 0437   GFRNONAA >60 02/25/2021 0437   GFRAA 45 (L) 06/26/2018 1409   Lipase     Component Value Date/Time   LIPASE <10 (L) 02/22/2021 1607    Studies/Results: No results found.  Anti-infectives: Anti-infectives (From admission, onward)    Start     Dose/Rate Route Frequency Ordered Stop   02/23/21 0845  ciprofloxacin (CIPRO) IVPB 400 mg        400 mg 200 mL/hr over 60 Minutes  Intravenous  Once 02/23/21 0745 02/23/21 1041   02/23/21 0845  metroNIDAZOLE (FLAGYL) IVPB 500 mg        500 mg 100 mL/hr over 60 Minutes Intravenous  Once 02/23/21 0745 02/23/21 1124       Assessment/Plan Gallstone ileus with SBO POD1 s/p  exploratory laparotomy, small bowel resection with anastomosis by Dr. 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 TOV today   Per primary: Pulm micronodules Mild polycythemia HTN CKD stage IIIb    LOS: 2 days   I reviewed last 24 h vitals and pain scores, last 48 h intake and output, last 24 h labs and trends, and last 24 h imaging results.  This care required moderate level of medical decision making.   [facility] 02/25/2021, 8:21 AM Please see Amion for pager number during day hours 7:00am-4:30pm or 7:00am -11:30am on weekends

## 2021-02-26 LAB — CBC
HCT: 42.2 % (ref 36.0–46.0)
Hemoglobin: 13.6 g/dL (ref 12.0–15.0)
MCH: 29.6 pg (ref 26.0–34.0)
MCHC: 32.2 g/dL (ref 30.0–36.0)
MCV: 91.7 fL (ref 80.0–100.0)
Platelets: 254 10*3/uL (ref 150–400)
RBC: 4.6 MIL/uL (ref 3.87–5.11)
RDW: 14.1 % (ref 11.5–15.5)
WBC: 10.3 10*3/uL (ref 4.0–10.5)
nRBC: 0 % (ref 0.0–0.2)

## 2021-02-26 LAB — BASIC METABOLIC PANEL
Anion gap: 10 (ref 5–15)
BUN: 16 mg/dL (ref 8–23)
CO2: 25 mmol/L (ref 22–32)
Calcium: 8.6 mg/dL — ABNORMAL LOW (ref 8.9–10.3)
Chloride: 105 mmol/L (ref 98–111)
Creatinine, Ser: 0.78 mg/dL (ref 0.44–1.00)
GFR, Estimated: >60 mL/min (ref >60)
Glucose, Bld: 92 mg/dL (ref 70–99)
Potassium: 3.4 mmol/L — ABNORMAL LOW (ref 3.5–5.1)
Sodium: 140 mmol/L (ref 135–145)

## 2021-02-26 LAB — SURGICAL PATHOLOGY

## 2021-02-26 LAB — PHOSPHORUS: Phosphorus: 1.7 mg/dL — ABNORMAL LOW (ref 2.5–4.6)

## 2021-02-26 LAB — MAGNESIUM: Magnesium: 2.2 mg/dL (ref 1.7–2.4)

## 2021-02-26 MED ORDER — FLUTICASONE PROPIONATE 50 MCG/ACT NA SUSP
1.0000 | Freq: Every day | NASAL | Status: DC
  Administered 2021-02-26 – 2021-02-27 (×2): 1 via NASAL
  Filled 2021-02-26: qty 16

## 2021-02-26 MED ORDER — POTASSIUM CHLORIDE 10 MEQ/100ML IV SOLN
10.0000 meq | INTRAVENOUS | Status: AC
  Administered 2021-02-26 (×4): 10 meq via INTRAVENOUS
  Filled 2021-02-26 (×4): qty 100

## 2021-02-26 MED ORDER — LABETALOL HCL 100 MG PO TABS
100.0000 mg | ORAL_TABLET | Freq: Two times a day (BID) | ORAL | Status: DC
  Administered 2021-02-26 – 2021-02-27 (×2): 100 mg via ORAL
  Filled 2021-02-26 (×3): qty 1

## 2021-02-26 MED ORDER — AMLODIPINE BESYLATE 10 MG PO TABS
10.0000 mg | ORAL_TABLET | Freq: Every day | ORAL | Status: DC
  Administered 2021-02-26 – 2021-02-27 (×2): 10 mg via ORAL
  Filled 2021-02-26 (×2): qty 1

## 2021-02-26 MED ORDER — LAMOTRIGINE 25 MG PO TABS
150.0000 mg | ORAL_TABLET | Freq: Every day | ORAL | Status: DC
  Administered 2021-02-26: 150 mg via ORAL
  Filled 2021-02-26: qty 2

## 2021-02-26 MED ORDER — POTASSIUM PHOSPHATES 15 MMOLE/5ML IV SOLN
30.0000 mmol | Freq: Once | INTRAVENOUS | Status: AC
  Administered 2021-02-26: 30 mmol via INTRAVENOUS
  Filled 2021-02-26: qty 10

## 2021-02-26 NOTE — Progress Notes (Signed)
°  3 Days Post-Op   Chief Complaint/Subjective: Moderate pain, +flatus, small BM yesterday, tolerating liquids, no nausea  Objective: Vital signs in last 24 hours: Temp:  [97.8 F (36.6 C)-100.3 F (37.9 C)] 98.9 F (37.2 C) (01/12 0612) Pulse Rate:  [80-98] 80 (01/12 0612) Resp:  [16-18] 18 (01/12 0612) BP: (124-162)/(81-97) 162/81 (01/12 0612) SpO2:  [96 %-99 %] 98 % (01/12 0612) Last BM Date: 02/25/21 Intake/Output from previous day: 01/11 0701 - 01/12 0700 In: 2739.2 [P.O.:960; I.V.:1779.2] Out: -  Intake/Output this shift: No intake/output data recorded.  PE: Gen: NAD Resp: nonlabored Card: RRR Abd: soft, incision intact without erythema, nondistended  Lab Results:  Recent Labs    02/25/21 0437 02/26/21 0427  WBC 12.4* 10.3  HGB 14.2 13.6  HCT 44.2 42.2  PLT 212 254   BMET Recent Labs    02/25/21 0437 02/26/21 0427  NA 139 140  K 3.4* 3.4*  CL 105 105  CO2 24 25  GLUCOSE 84 92  BUN 24* 16  CREATININE 0.91 0.78  CALCIUM 8.1* 8.6*   PT/INR No results for input(s): LABPROT, INR in the last 72 hours. CMP     Component Value Date/Time   NA 140 02/26/2021 0427   K 3.4 (L) 02/26/2021 0427   CL 105 02/26/2021 0427   CO2 25 02/26/2021 0427   GLUCOSE 92 02/26/2021 0427   BUN 16 02/26/2021 0427   CREATININE 0.78 02/26/2021 0427   CALCIUM 8.6 (L) 02/26/2021 0427   PROT 6.4 (L) 02/25/2021 0437   ALBUMIN 3.0 (L) 02/25/2021 0437   AST 21 02/25/2021 0437   ALT 16 02/25/2021 0437   ALKPHOS 47 02/25/2021 0437   BILITOT 1.0 02/25/2021 0437   GFRNONAA >60 02/26/2021 0427   GFRAA 45 (L) 06/26/2018 1409   Lipase     Component Value Date/Time   LIPASE <10 (L) 02/22/2021 1607    Studies/Results: No results found.  Anti-infectives: Anti-infectives (From admission, onward)    Start     Dose/Rate Route Frequency Ordered Stop   02/23/21 0845  ciprofloxacin (CIPRO) IVPB 400 mg        400 mg 200 mL/hr over 60 Minutes Intravenous  Once 02/23/21 0745  02/23/21 1041   02/23/21 0845  metroNIDAZOLE (FLAGYL) IVPB 500 mg        500 mg 100 mL/hr over 60 Minutes Intravenous  Once 02/23/21 0745 02/23/21 1124       Assessment/Plan Gallstone ileus with SBO POD3 s/p  exploratory laparotomy, small bowel resection with anastomosis by Dr. 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  This care required moderate level of medical decision making.   [facility] 02/26/2021, 8:56 AM Please see Amion for pager number during day hours 7:00am-4:30pm or 7:00am -11:30am on weekends

## 2021-02-26 NOTE — Progress Notes (Signed)
But no PROGRESS NOTE  Jacqueline Riggs UJW:[id number] DOB: 03-09-1955 DOA: 02/22/2021 PCP: Henrine Screws, MD   LOS: 3 days   Brief narrative:  Jacqueline Riggs is a 66 y.o. female with past medical history of hypertension, hyperlipidemia, CKD stage III, GERD, obstructive sleep apnea, depression presented to hospital with abdominal pain and distention.  In the ED, patient was noted to be tachycardic and febrile with elevated blood pressure.  Labs showed WBC at 13.8 with creatinine of 1.6 with baseline 1.4 in May 2020.  CT scan of the abdomen and pelvis showed gallstone ileus and small bowel obstruction.   Of note, patient had colonoscopy on December 8th with a previous history of total abdominal hysterectomy.  General surgery was consulted from the ED and NG tube was placed and patient was then admitted to the hospital for further evaluation and treatment.  Assessment/Plan:  Principal Problem:   Gallstone ileus (HCC) Active Problems:   SBO (small bowel obstruction) (HCC)   Essential hypertension   Hyperlipidemia   CKD (chronic kidney disease), stage III (HCC)  Gallstone ileus with small bowel obstruction. Status post exploratory laparotomy with small bowel resection and anastomosis by Dr. Sheliah Hatch on 02/23/2021.  Off NG tube today.  Has been advanced to soft diet.  Surgery on board.  Continue PT OT incentive spirometry and out of bed to chair.  Encourage deep breathing.  Further management as per surgical team.  Patient will have to follow-up with general surgery in the interim to discuss about Repair of choledochoduodenal fistula in the future  Pulmonary micronodules CT scan showing clustered micronodules.  History of smoking 10 years ago.  Procalcitonin 0.1.  Might benefit from dedicated chest CT for further evaluation after resolution of gallstone ileus/SBO as outpatient.   Mild polycythemia Latest hemoglobin of 13.6 likely secondary to volume depletion.  Received IV fluid during  hospitalization.  Hypokalemia.  Mild.  We will continue to replenish through IV.  Check levels in a.m.  Latest potassium of 3.4.  Hypophosphatemia.  We will replace with K-Phos today.  Check phosphate level in AM.  Hypertension Continue IV labetalol prn SBP >170, added as needed hydralazine.  Patient takes amlodipine and labetalol at home.  Will resume charting today.   Acute kidney injury on CKD stage IIIb Creatinine of 0.9.  Baseline creatinine of around 1.4 in May 2020.  Improved at this time.  Latest creatinine of 0.7.  DVT prophylaxis: SCDs Start: 02/23/21 5621  Disposition.  Likely home tomorrow if okay with surgery. Code Status: Full code  Family Communication:  Spoke with the patient at bedside, I also spoke with the patient's husband on the phone and updated him about the clinical condition of the patient.  Status is: Inpatient  Remains inpatient appropriate because: Status post surgical intervention, electrolyte imbalances  Consultants: General surgery  Procedures: NG tube placement and removal Exploratory laparotomy with bowel resection and anastomosis on 02/23/2021.  Anti-infectives:  None  Anti-infectives (From admission, onward)    Start     Dose/Rate Route Frequency Ordered Stop   02/23/21 0845  ciprofloxacin (CIPRO) IVPB 400 mg        400 mg 200 mL/hr over 60 Minutes Intravenous  Once 02/23/21 0745 02/23/21 1041   02/23/21 0845  metroNIDAZOLE (FLAGYL) IVPB 500 mg        500 mg 100 mL/hr over 60 Minutes Intravenous  Once 02/23/21 0745 02/23/21 1124      Subjective: Today, patient was seen and examined ambulatory has tolerated  clears.  Has had some flatus and a bowel movement.  Feels better.  No nausea, vomiting.  Objective: Vitals:   02/25/21 2115 02/26/21 0612  BP:  (!) 162/81  Pulse:  80  Resp:  18  Temp: 98.4 F (36.9 C) 98.9 F (37.2 C)  SpO2:  98%    Intake/Output Summary (Last 24 hours) at 02/26/2021 1157 Last data filed at 02/26/2021  0606 Gross per 24 hour  Intake 2379.21 ml  Output --  Net 2379.21 ml    Filed Weights   02/22/21 1558  Weight: 89.4 kg   Body mass index is 36.03 kg/m.   Physical Exam: General:   Obese built, not in obvious distress, HENT:   No scleral pallor or icterus noted. Oral mucosa is moist.  Chest:  Diminished breath sounds bilaterally. No crackles or wheezes.  CVS: S1 &S2 heard. No murmur.  Regular rate and rhythm. Abdomen: Soft, status post surgery,  Bowel sounds are heard.   Extremities: No cyanosis, clubbing or edema.  Peripheral pulses are palpable. Psych: Alert, awake and oriented, normal mood CNS:  No cranial nerve deficits.  Power equal in all extremities.   Skin: Warm and dry.  No rashes noted.  Data Review: I have personally reviewed the following laboratory data and studies,  CBC: Recent Labs  Lab 02/22/21 1607 02/23/21 0715 02/24/21 0439 02/25/21 0437 02/26/21 0427  WBC 10.8* 7.4 12.4* 12.4* 10.3  HGB 16.9* 16.1* 16.3* 14.2 13.6  HCT 52.4* 48.8* 49.0* 44.2 42.2  MCV 90.0 90.5 89.7 94.2 91.7  PLT 299 294 277 212 0000000    Basic Metabolic Panel: Recent Labs  Lab 02/22/21 1607 02/23/21 0715 02/24/21 0439 02/25/21 0437 02/26/21 0427  NA 137 138 135 139 140  K 3.8 3.6 3.9 3.4* 3.4*  CL 98 102 100 105 105  CO2 24 24 25 24 25   GLUCOSE 150* 109* 125* 84 92  BUN 20 31* 20 24* 16  CREATININE 1.67* 1.74* 1.11* 0.91 0.78  CALCIUM 10.2 9.5 8.4* 8.1* 8.6*  MG  --  2.2 2.2 2.3 2.2  PHOS  --   --  2.6  --  1.7*    Liver Function Tests: Recent Labs  Lab 02/22/21 1607 02/24/21 0439 02/25/21 0437  AST 18 27 21   ALT 14 17 16   ALKPHOS 69 54 47  BILITOT 0.8 1.8* 1.0  PROT 7.8 7.1 6.4*  ALBUMIN 4.4 3.3* 3.0*    Recent Labs  Lab 02/22/21 1607  LIPASE <10*    No results for input(s): AMMONIA in the last 168 hours. Cardiac Enzymes: No results for input(s): CKTOTAL, CKMB, CKMBINDEX, TROPONINI in the last 168 hours. BNP (last 3 results) No results for  input(s): BNP in the last 8760 hours.  ProBNP (last 3 results) No results for input(s): PROBNP in the last 8760 hours.  CBG: No results for input(s): GLUCAP in the last 168 hours. Recent Results (from the past 240 hour(s))  Resp Panel by RT-PCR (Flu A&B, Covid) Nasopharyngeal Swab     Status: None   Collection Time: 02/22/21  4:01 PM   Specimen: Nasopharyngeal Swab; Nasopharyngeal(NP) swabs in vial transport medium  Result Value Ref Range Status   SARS Coronavirus 2 by RT PCR NEGATIVE NEGATIVE Final    Comment: (NOTE) SARS-CoV-2 target nucleic acids are NOT DETECTED.  The SARS-CoV-2 RNA is generally detectable in upper respiratory specimens during the acute phase of infection. The lowest concentration of SARS-CoV-2 viral copies this assay can detect is 138 copies/mL.  A negative result does not preclude SARS-Cov-2 infection and should not be used as the sole basis for treatment or other patient management decisions. A negative result may occur with  improper specimen collection/handling, submission of specimen other than nasopharyngeal swab, presence of viral mutation(s) within the areas targeted by this assay, and inadequate number of viral copies(<138 copies/mL). A negative result must be combined with clinical observations, patient history, and epidemiological information. The expected result is Negative.  Fact Sheet for Patients:  EntrepreneurPulse.com.au  Fact Sheet for Healthcare Providers:  IncredibleEmployment.be  This test is no t yet approved or cleared by the [place] FDA and  has been authorized for detection and/or diagnosis of SARS-CoV-2 by FDA under an Emergency Use Authorization (EUA). This EUA will remain  in effect (meaning this test can be used) for the duration of the COVID-19 declaration under Section 564(b)(1) of the Act, 21 U.S.C.section 360bbb-3(b)(1), unless the authorization is terminated  or revoked sooner.        Influenza A by PCR NEGATIVE NEGATIVE Final   Influenza B by PCR NEGATIVE NEGATIVE Final    Comment: (NOTE) The Xpert Xpress SARS-CoV-2/FLU/RSV plus assay is intended as an aid in the diagnosis of influenza from Nasopharyngeal swab specimens and should not be used as a sole basis for treatment. Nasal washings and aspirates are unacceptable for Xpert Xpress SARS-CoV-2/FLU/RSV testing.  Fact Sheet for Patients: EntrepreneurPulse.com.au  Fact Sheet for Healthcare Providers: IncredibleEmployment.be  This test is not yet approved or cleared by the [place] FDA and has been authorized for detection and/or diagnosis of SARS-CoV-2 by FDA under an Emergency Use Authorization (EUA). This EUA will remain in effect (meaning this test can be used) for the duration of the COVID-19 declaration under Section 564(b)(1) of the Act, 21 U.S.C. section 360bbb-3(b)(1), unless the authorization is terminated or revoked.  Performed at [facility], [address], [place], [place] [address]       Studies: No results found.   Flora Lipps, MD  [facility] 02/26/2021  If 7PM-7AM, please contact night-coverage

## 2021-02-26 NOTE — Progress Notes (Signed)
Physical Therapy Treatment Patient Details Name: Jacqueline Riggs MRN: [id number] DOB: 1955/09/03 Today's Date: 02/26/2021   History of Present Illness 66 y.o. female with s/p exploratory laparotomy, small bowel resection with anastomosis on 02/24/20. PMH: CKD,  hypertension, hyperlipidemia, CKD stage III, GERD, OSA, L DA THA, R TSA, depression presented to ED complaining of abdominal pain and distention, general surgery consulted.    PT Comments    Pt is making steady progress with mobility. Pt is mod indep in the room. Pt discussed possibility of d/c home tomorrow. Pt's spouse was present and we discussed no follow PT indicated. Recommend continuing log roll technique with bed mobility and increasing gait distance as tolerated for imporved activity tolerance. Pt goals were updated to indep level. Pt will continue to benefit from acute skilled PT to maximize mobility and independence for d/c home.  Recommendations for follow up therapy are one component of a multi-disciplinary discharge planning process, led by the attending physician.  Recommendations may be updated based on patient status, additional functional criteria and insurance authorization.  Follow Up Recommendations  No PT follow up     Assistance Recommended at Discharge Intermittent Supervision/Assistance  Patient can return home with the following Assistance with cooking/housework;Help with stairs or ramp for entrance   Equipment Recommendations  None recommended by PT    Recommendations for Other Services       Precautions / Restrictions Precautions Precautions: None Restrictions Weight Bearing Restrictions: No     Mobility  Bed Mobility Overal bed mobility: Modified Independent Bed Mobility: Supine to Sit     Supine to sit: Modified independent (Device/Increase time)          Transfers Overall transfer level: Modified independent Equipment used: None Transfers: Sit to/from Stand Sit to Stand: Modified  independent (Device/Increase time)                Ambulation/Gait Ambulation/Gait assistance: Supervision Gait Distance (Feet): 400 Feet Assistive device: IV Pole Gait Pattern/deviations: Step-through pattern Gait velocity: decr     General Gait Details: one standing rest break, able to turn head without LOB or change in cadence.   Stairs             Wheelchair Mobility    Modified Rankin (Stroke Patients Only)       Balance Overall balance assessment: No apparent balance deficits (not formally assessed)                                          Cognition Arousal/Alertness: Awake/alert Behavior During Therapy: WFL for tasks assessed/performed Overall Cognitive Status: Within Functional Limits for tasks assessed                                 General Comments: Pt reported she may d/c home tomorrow. Attempted to see pt 3 times as she was delayed eating breakfast and wasnt ready for therapy.        Exercises      General Comments        Pertinent Vitals/Pain Faces Pain Scale: Hurts a little bit Pain Location: abd Pain Descriptors / Indicators: Discomfort Pain Intervention(s): Limited activity within patient's tolerance;Monitored during session    Home Living  Prior Function            PT Goals (current goals can now be found in the care plan section) Progress towards PT goals: Goals met and updated - see care plan    Frequency    Min 3X/week      PT Plan Other (comment) (goals updated)    Co-evaluation              AM-PAC PT "6 Clicks" Mobility   Outcome Measure  Help needed turning from your back to your side while in a flat bed without using bedrails?: None Help needed moving from lying on your back to sitting on the side of a flat bed without using bedrails?: None Help needed moving to and from a bed to a chair (including a wheelchair)?: None Help needed  standing up from a chair using your arms (e.g., wheelchair or bedside chair)?: None Help needed to walk in hospital room?: A Little Help needed climbing 3-5 steps with a railing? : A Lot 6 Click Score: 21    End of Session   Activity Tolerance: Patient tolerated treatment well Patient left: in bed Nurse Communication: Mobility status PT Visit Diagnosis: Other abnormalities of gait and mobility (R26.89)     Time: 1995-7900 PT Time Calculation (min) (ACUTE ONLY): 17 min  Charges:  $Gait Training: 8-22 mins                       Lelon Mast 02/26/2021, 11:49 AM

## 2021-02-27 LAB — COMPREHENSIVE METABOLIC PANEL
ALT: 37 U/L (ref 0–44)
AST: 40 U/L (ref 15–41)
Albumin: 2.6 g/dL — ABNORMAL LOW (ref 3.5–5.0)
Alkaline Phosphatase: 40 U/L (ref 38–126)
Anion gap: 11 (ref 5–15)
BUN: 11 mg/dL (ref 8–23)
CO2: 20 mmol/L — ABNORMAL LOW (ref 22–32)
Calcium: 7.7 mg/dL — ABNORMAL LOW (ref 8.9–10.3)
Chloride: 102 mmol/L (ref 98–111)
Creatinine, Ser: 0.62 mg/dL (ref 0.44–1.00)
GFR, Estimated: >60 mL/min (ref >60)
Glucose, Bld: 85 mg/dL (ref 70–99)
Potassium: 3.2 mmol/L — ABNORMAL LOW (ref 3.5–5.1)
Sodium: 133 mmol/L — ABNORMAL LOW (ref 135–145)
Total Bilirubin: 0.7 mg/dL (ref 0.3–1.2)
Total Protein: 5.3 g/dL — ABNORMAL LOW (ref 6.5–8.1)

## 2021-02-27 LAB — CBC
HCT: 36.2 % (ref 36.0–46.0)
Hemoglobin: 11.8 g/dL — ABNORMAL LOW (ref 12.0–15.0)
MCH: 30.2 pg (ref 26.0–34.0)
MCHC: 32.6 g/dL (ref 30.0–36.0)
MCV: 92.6 fL (ref 80.0–100.0)
Platelets: 219 10*3/uL (ref 150–400)
RBC: 3.91 MIL/uL (ref 3.87–5.11)
RDW: 14 % (ref 11.5–15.5)
WBC: 6.5 10*3/uL (ref 4.0–10.5)
nRBC: 0 % (ref 0.0–0.2)

## 2021-02-27 LAB — PHOSPHORUS: Phosphorus: 3.5 mg/dL (ref 2.5–4.6)

## 2021-02-27 MED ORDER — POTASSIUM CHLORIDE CRYS ER 20 MEQ PO TBCR: 20.0000 meq | EXTENDED_RELEASE_TABLET | Freq: Every day | ORAL | 0 refills | Status: DC

## 2021-02-27 MED ORDER — POTASSIUM CHLORIDE 10 MEQ/100ML IV SOLN
10.0000 meq | INTRAVENOUS | Status: AC
  Administered 2021-02-27 (×3): 10 meq via INTRAVENOUS
  Filled 2021-02-27 (×3): qty 100

## 2021-02-27 MED ORDER — POTASSIUM CHLORIDE CRYS ER 20 MEQ PO TBCR
40.0000 meq | EXTENDED_RELEASE_TABLET | Freq: Once | ORAL | Status: AC
  Administered 2021-02-27: 40 meq via ORAL
  Filled 2021-02-27: qty 2

## 2021-02-27 MED ORDER — CALCIUM GLUCONATE-NACL 1-0.675 GM/50ML-% IV SOLN
1.0000 g | Freq: Once | INTRAVENOUS | Status: AC
  Administered 2021-02-27: 1000 mg via INTRAVENOUS
  Filled 2021-02-27: qty 50

## 2021-02-27 MED ORDER — OXYCODONE-ACETAMINOPHEN 5-325 MG PO TABS: 1.0000 | ORAL_TABLET | Freq: Four times a day (QID) | ORAL | 0 refills | Status: AC | PRN

## 2021-02-27 NOTE — Progress Notes (Signed)
°  4 Days Post-Op   Chief Complaint/Subjective: Pain is well controlled. Tolerating soft diet. No new complaints  Objective: Vital signs in last 24 hours: Temp:  [98.3 F (36.8 C)-99.4 F (37.4 C)] 98.3 F (36.8 C) (01/13 0538) Pulse Rate:  [69-89] 69 (01/13 0912) Resp:  [17-20] 18 (01/13 0538) BP: (139-156)/(78-88) 141/80 (01/13 0912) SpO2:  [97 %-99 %] 97 % (01/13 0538) Last BM Date: 02/26/21 Intake/Output from previous day: 01/12 0701 - 01/13 0700 In: 718.9 [I.V.:457.5; IV Piggyback:261.4] Out: -  Intake/Output this shift: No intake/output data recorded.  PE: Gen: NAD Resp: nonlabored Card: RRR Abd: soft, incision intact without erythema, nondistended. Dressing removed and staples intact without drainage  Lab Results:  Recent Labs    02/26/21 0427 02/27/21 0457  WBC 10.3 6.5  HGB 13.6 11.8*  HCT 42.2 36.2  PLT 254 219    BMET Recent Labs    02/26/21 0427 02/27/21 0457  NA 140 133*  K 3.4* 3.2*  CL 105 102  CO2 25 20*  GLUCOSE 92 85  BUN 16 11  CREATININE 0.78 0.62  CALCIUM 8.6* 7.7*    PT/INR No results for input(s): LABPROT, INR in the last 72 hours. CMP     Component Value Date/Time   NA 133 (L) 02/27/2021 0457   K 3.2 (L) 02/27/2021 0457   CL 102 02/27/2021 0457   CO2 20 (L) 02/27/2021 0457   GLUCOSE 85 02/27/2021 0457   BUN 11 02/27/2021 0457   CREATININE 0.62 02/27/2021 0457   CALCIUM 7.7 (L) 02/27/2021 0457   PROT 5.3 (L) 02/27/2021 0457   ALBUMIN 2.6 (L) 02/27/2021 0457   AST 40 02/27/2021 0457   ALT 37 02/27/2021 0457   ALKPHOS 40 02/27/2021 0457   BILITOT 0.7 02/27/2021 0457   GFRNONAA >60 02/27/2021 0457   GFRAA 45 (L) 06/26/2018 1409   Lipase     Component Value Date/Time   LIPASE <10 (L) 02/22/2021 1607    Studies/Results: No results found.  Anti-infectives: Anti-infectives (From admission, onward)    Start     Dose/Rate Route Frequency Ordered Stop   02/23/21 0845  ciprofloxacin (CIPRO) IVPB 400 mg        400  mg 200 mL/hr over 60 Minutes Intravenous  Once 02/23/21 0745 02/23/21 1041   02/23/21 0845  metroNIDAZOLE (FLAGYL) IVPB 500 mg        500 mg 100 mL/hr over 60 Minutes Intravenous  Once 02/23/21 0745 02/23/21 1124       Assessment/Plan Gallstone ileus with SBO POD4 s/p  exploratory laparotomy, small bowel resection with anastomosis by Dr. Kieth Brightly 1/9 - tolerating soft diet -ambulate - pathology with active chronic ileitis compatible with Crohn's disease   She will likely need interval repair of choleduodenal fistula in the future which has been discussed with patient  Dispo: stable for discharge from general surgery perspective. Discussed pathology and follow up with GI (Dr. Watt Climes). Follow up with [place] scheduled. Will send pain medications     FEN: soft diet ID: cipro/flagyl periop VTE: none currently Foley: removed   Per primary: Pulm micronodules Mild polycythemia HTN CKD stage IIIb      LOS: 4 days    [facility] 02/27/2021, 10:48 AM Please see Amion for pager number during day hours 7:00am-4:30pm or 7:00am -11:30am on weekends

## 2021-02-27 NOTE — Progress Notes (Signed)
Pt discharged to home with husband. Discharge instructions and medication education provided to pt.  

## 2021-02-27 NOTE — Discharge Summary (Signed)
Physician Discharge Summary  Jacqueline Riggs:[id number] DOB: 03-18-1955 DOA: 02/22/2021  PCP: Henrine Screws, MD  Admit date: 02/22/2021 Discharge date: 02/27/2021  Admitted From: Home  Discharge disposition: Home   Recommendations for Outpatient Follow-Up:   Follow up with your primary care provider in one week.  Micronodules were described in the right lung in the CT scan of the abdomen and pelvis.  This will need to be followed up as outpatient Check CBC, CMP, magnesium, phosphorus in the next visit Follow-up with surgery on 03/09/2021 for staples removal and on 04/01/2021 as well. Follow-up with Dr. Ewing [facility] to follow-up for history of Crohn's disease.  Discharge Diagnosis:   Principal Problem:   Gallstone ileus (HCC) Active Problems:   SBO (small bowel obstruction) (HCC)   Essential hypertension   Hyperlipidemia   CKD (chronic kidney disease), stage III (HCC)  Discharge Condition: Improved.  Diet recommendation: Low sodium, heart healthy.  Soft diet and advance as tolerated  Wound care: Local incision site care  Code status: Full.  History of Present Illness:   Jacqueline Riggs is a 66 y.o. female with past medical history of hypertension, hyperlipidemia, CKD stage III, GERD, obstructive sleep apnea, depression presented to hospital with abdominal pain and distention.  In the ED, patient was noted to be tachycardic and febrile with elevated blood pressure.  Labs showed WBC at 13.8 with creatinine of 1.6 with baseline 1.4 in May 2020.  CT scan of the abdomen and pelvis showed gallstone ileus and small bowel obstruction.   Of note, patient had colonoscopy on December 8th with a previous history of total abdominal hysterectomy.  General surgery was consulted from the ED and NG tube was placed and patient was then admitted to the hospital for further evaluation and treatment.  Hospital Course:   Following conditions were addressed during hospitalization as listed  below,  Gallstone ileus with small bowel obstruction. Status post exploratory laparotomy with small bowel resection and anastomosis by Dr. Sheliah Hatch on 02/23/2021.    Has been advanced to soft diet and has tolerated well.  Surgery has followed the patient and recommended outpatient follow-up.  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  Patient will also follow-up with Dr. Morey [facility] as outpatient.   Pulmonary micronodules CT scan showing clustered micronodules.  History of smoking 10 years ago.  Procalcitonin 0.1.  Might benefit from follow-up with PCP further evaluation    Mild polycythemia On presentation coupled with volume depletion.  Has improved at this time.  Hemoglobin of 11.8 at this time   Hypokalemia.  Mild.  We will continue oral potassium supplement on discharge.  Patient was given 30 mEq of IV potassium prior to discharge   Hypophosphatemia.  Phosphorus prior to discharge was 3.5.  Received K-Phos yesterday.    Essential hypertension Resume amlodipine and labetalol from home.   Acute kidney injury on CKD stage IIIb Creatinine of 0.96 today.  Stable at this time.  Disposition.  At this time, patient is stable for disposition home with outpatient PCP GI and general surgery follow-up.  Spoke with the patient's husband about disposition.  Medical Consultants:   General surgery Procedures:    NG tube placement and removal Exploratory laparotomy with bowel resection and anastomosis on 02/23/2021. Subjective:   Today, patient was seen and examined at bedside.  Feels better.  Denies any nausea vomiting or overt pain.  Has mild soreness in the abdomen.  Discharge Exam:   Vitals:  02/27/21 0538 02/27/21 0912  BP: (!) 145/79 (!) 141/80  Pulse: 70 69  Resp: 18   Temp: 98.3 F (36.8 C)   SpO2: 97%    Vitals:   02/26/21 1943 02/26/21 2104 02/27/21 0538 02/27/21 0912  BP: 139/88  (!) 145/79 (!) 141/80  Pulse: 89 88 70 69  Resp: 17  18    Temp: 99.4 F (37.4 C)  98.3 F (36.8 C)   TempSrc: Oral  Oral   SpO2: 99%  97%   Weight:      Height:       General: Alert awake, not in obvious distress, obese built HENT: pupils equally reacting to light,  No scleral pallor or icterus noted. Oral mucosa is moist.  Chest:  .  Diminished breath sounds bilaterally. No crackles or wheezes.  CVS: S1 &S2 heard. No murmur.  Regular rate and rhythm. Abdomen: Soft, incision site healthy without erythema.  Abdomen nondistended.  Bowel sounds are heard.   Extremities: No cyanosis, clubbing or edema.  Peripheral pulses are palpable. Psych: Alert, awake and oriented, normal mood CNS:  No cranial nerve deficits.  Power equal in all extremities.   Skin: Warm and dry.  Abdominal incision.  The results of significant diagnostics from this hospitalization (including imaging, microbiology, ancillary and laboratory) are listed below for reference.     Diagnostic Studies:   CT ABDOMEN PELVIS WO CONTRAST  Result Date: 02/22/2021 CLINICAL DATA:  Nonlocalized abdominal pain.  Nausea. EXAM: CT ABDOMEN AND PELVIS WITHOUT CONTRAST TECHNIQUE: Multidetector CT imaging of the abdomen and pelvis was performed following the standard protocol without IV contrast. COMPARISON:  No Peri dominantly imaging. FINDINGS: Lower chest: Clustered micro nodules in the dependent right lower lobe. No pleural effusion. Heart is normal in size. Hepatobiliary: 14 mm cyst in the right lobe of the liver. No suspicious liver lesion. The gallbladder is contracted. Question choleduodenal fistula, series 5, image 63. No calcified intraluminal gallstone. Mild common bile duct dilatation at 8 mm. No visualized pneumobilia. Pancreas: No ductal dilatation or inflammation. Spleen: Normal in size without focal abnormality. Adrenals/Urinary Tract: Normal adrenal glands. Mild left renal atrophy and renal parenchymal thinning. No hydronephrosis or renal calculi. 11 mm cyst arises from the lower right  kidney. No evidence of solid renal lesion. Stomach/Bowel: Dilated fluid-filled stomach and small bowel. Transition point at 2.1 cm lamellated calcification (presumed gallstone) within the distal ileum, series 2, image 64. Loss of fat plane between the gallbladder and duodenum suspicious for choleduodenal fistula, series 5, image 63. Air within the non dependent duodenum and proximal small bowel is presumably intraluminal rather than pneumatosis. There is no non dependent air in the bowel wall. The appendix is not definitively visualized. The ascending, transverse, proximal descending colon are completely decompressed. Small volume of formed stool in the sigmoid colon. Moderate sigmoid colonic diverticulosis without diverticulitis. Vascular/Lymphatic: Normal caliber abdominal aorta with mild atherosclerosis. There is no portal venous or mesenteric gas. Scattered small mesenteric lymph nodes, likely reactive. There are few prominent bilateral inguinal nodes. Reproductive: Hysterectomy.  No adnexal mass. Other: Small volume free fluid/ascites within the pelvis and right upper quadrant. There is no free air. Mild generalized mesenteric edema. Musculoskeletal: Chronic bilateral L5 pars interarticularis defects with grade 1 anterolisthesis of L5 on S1 and near complete L5-S1 disc space loss. Additional degenerative change throughout the lumbar spine left hip arthroplasty. IMPRESSION: 1. Findings consistent with gallstone ileus/small bowel obstruction. Dilated fluid-filled stomach and small bowel with transition point in the distal ileum  where there is a 2.1 cm lamellated calcification (presumed gallstone) in the distal ileum. Loss of fat plane between the gallbladder and duodenum suspicious for choleduodenal fistula. 2. Air within the non dependent duodenum and proximal small bowel is presumably intraluminal rather than pneumatosis. No free air. 3. Small volume free fluid/ascites within the pelvis and right upper  quadrant. 4. Clustered micro nodules in the dependent right lower lobe, likely infectious or inflammatory. 5. Sigmoid colonic diverticulosis without diverticulitis. 6. Chronic bilateral L5 pars interarticularis defects with grade 1 anterolisthesis of L5 on S1 and near complete L5-S1 disc space loss. Aortic Atherosclerosis (ICD10-I70.0). Electronically Signed   By: Keith Rake M.D.   On: 02/22/2021 17:50   DG Abdomen 1 View  Result Date: 02/22/2021 CLINICAL DATA:  NG tube placement EXAM: ABDOMEN - 1 VIEW COMPARISON:  CT 02/22/2021 FINDINGS: Esophageal tube tip overlies the stomach, side-port in the region of GE junction. Multiple air distended small bowel loops consistent with a bowel obstruction IMPRESSION: Esophageal tube side-port in the region of GE junction, consider further advancement for more optimal positioning. Multiple dilated loops of small bowel consistent with a bowel obstruction. Electronically Signed   By: Donavan Foil M.D.   On: 02/22/2021 19:41     Labs:   Basic Metabolic Panel: Recent Labs  Lab 02/23/21 0715 02/24/21 0439 02/25/21 0437 02/26/21 0427 02/27/21 0457  NA 138 135 139 140 133*  K 3.6 3.9 3.4* 3.4* 3.2*  CL 102 100 105 105 102  CO2 24 25 24 25  20*  GLUCOSE 109* 125* 84 92 85  BUN 31* 20 24* 16 11  CREATININE 1.74* 1.11* 0.91 0.78 0.62  CALCIUM 9.5 8.4* 8.1* 8.6* 7.7*  MG 2.2 2.2 2.3 2.2  --   PHOS  --  2.6  --  1.7* 3.5   GFR Estimated Creatinine Clearance: 70.9 mL/min (by C-G formula based on SCr of 0.62 mg/dL). Liver Function Tests: Recent Labs  Lab 02/22/21 1607 02/24/21 0439 02/25/21 0437 02/27/21 0457  AST 18 27 21  40  ALT 14 17 16  37  ALKPHOS 69 54 47 40  BILITOT 0.8 1.8* 1.0 0.7  PROT 7.8 7.1 6.4* 5.3*  ALBUMIN 4.4 3.3* 3.0* 2.6*   Recent Labs  Lab 02/22/21 1607  LIPASE <10*   No results for input(s): AMMONIA in the last 168 hours. Coagulation profile No results for input(s): INR, PROTIME in the last 168  hours.  CBC: Recent Labs  Lab 02/23/21 0715 02/24/21 0439 02/25/21 0437 02/26/21 0427 02/27/21 0457  WBC 7.4 12.4* 12.4* 10.3 6.5  HGB 16.1* 16.3* 14.2 13.6 11.8*  HCT 48.8* 49.0* 44.2 42.2 36.2  MCV 90.5 89.7 94.2 91.7 92.6  PLT 294 277 212 254 219   Cardiac Enzymes: No results for input(s): CKTOTAL, CKMB, CKMBINDEX, TROPONINI in the last 168 hours. BNP: Invalid input(s): POCBNP CBG: No results for input(s): GLUCAP in the last 168 hours. D-Dimer No results for input(s): DDIMER in the last 72 hours. Hgb A1c No results for input(s): HGBA1C in the last 72 hours. Lipid Profile No results for input(s): CHOL, HDL, LDLCALC, TRIG, CHOLHDL, LDLDIRECT in the last 72 hours. Thyroid function studies No results for input(s): TSH, T4TOTAL, T3FREE, THYROIDAB in the last 72 hours.  Invalid input(s): FREET3 Anemia work up No results for input(s): VITAMINB12, FOLATE, FERRITIN, TIBC, IRON, RETICCTPCT in the last 72 hours. Microbiology Recent Results (from the past 240 hour(s))  Resp Panel by RT-PCR (Flu A&B, Covid) Nasopharyngeal Swab     Status:  None   Collection Time: 02/22/21  4:01 PM   Specimen: Nasopharyngeal Swab; Nasopharyngeal(NP) swabs in vial transport medium  Result Value Ref Range Status   SARS Coronavirus 2 by RT PCR NEGATIVE NEGATIVE Final    Comment: (NOTE) SARS-CoV-2 target nucleic acids are NOT DETECTED.  The SARS-CoV-2 RNA is generally detectable in upper respiratory specimens during the acute phase of infection. The lowest concentration of SARS-CoV-2 viral copies this assay can detect is 138 copies/mL. A negative result does not preclude SARS-Cov-2 infection and should not be used as the sole basis for treatment or other patient management decisions. A negative result may occur with  improper specimen collection/handling, submission of specimen other than nasopharyngeal swab, presence of viral mutation(s) within the areas targeted by this assay, and inadequate  number of viral copies(<138 copies/mL). A negative result must be combined with clinical observations, patient history, and epidemiological information. The expected result is Negative.  Fact Sheet for Patients:  EntrepreneurPulse.com.au  Fact Sheet for Healthcare Providers:  IncredibleEmployment.be  This test is no t yet approved or cleared by the [place] FDA and  has been authorized for detection and/or diagnosis of SARS-CoV-2 by FDA under an Emergency Use Authorization (EUA). This EUA will remain  in effect (meaning this test can be used) for the duration of the COVID-19 declaration under Section 564(b)(1) of the Act, 21 U.S.C.section 360bbb-3(b)(1), unless the authorization is terminated  or revoked sooner.       Influenza A by PCR NEGATIVE NEGATIVE Final   Influenza B by PCR NEGATIVE NEGATIVE Final    Comment: (NOTE) The Xpert Xpress SARS-CoV-2/FLU/RSV plus assay is intended as an aid in the diagnosis of influenza from Nasopharyngeal swab specimens and should not be used as a sole basis for treatment. Nasal washings and aspirates are unacceptable for Xpert Xpress SARS-CoV-2/FLU/RSV testing.  Fact Sheet for Patients: EntrepreneurPulse.com.au  Fact Sheet for Healthcare Providers: IncredibleEmployment.be  This test is not yet approved or cleared by the [place] FDA and has been authorized for detection and/or diagnosis of SARS-CoV-2 by FDA under an Emergency Use Authorization (EUA). This EUA will remain in effect (meaning this test can be used) for the duration of the COVID-19 declaration under Section 564(b)(1) of the Act, 21 U.S.C. section 360bbb-3(b)(1), unless the authorization is terminated or revoked.  Performed at [facility], [address] [place], [place] [address]      Discharge Instructions:   Discharge Instructions     Call MD for:  persistant  nausea and vomiting   Complete by: As directed    Call MD for:  severe uncontrolled pain   Complete by: As directed    Call MD for:  temperature >100.4   Complete by: As directed    Diet - low sodium heart healthy   Complete by: As directed    Soft diet   Discharge instructions   Complete by: As directed    Follow-up with primary care physician in 1 week and also discuss about micronodules in the right lung described on the CT abdomen. Check blood work on the next visit.  Follow-up with general surgery as has been scheduled by the clinic. Follow up with GI Dr Watt Climes when scheduled by the clinic.  Seek medical attention for worsening symptoms.   Discharge wound care:   Complete by: As directed    Abdominal wound care   Increase activity slowly   Complete by: As directed       Allergies as of 02/27/2021  Reactions   Penicillins Rash   Did it involve swelling of the face/tongue/throat, SOB, or low BP? Yes Did it involve sudden or severe rash/hives, skin peeling, or any reaction on the inside of your mouth or nose? No Did you need to seek medical attention at a hospital or doctor's office? Yes When did it last happen? Late 1980s If all above answers are "NO", may proceed with cephalosporin use.   Dexilant [dexlansoprazole] Other (See Comments)   Stomach pain and cramps   Prilosec [omeprazole] Other (See Comments)   Stomach pain and cramps   Shellfish Allergy Diarrhea   Oysters and mussles   Ceclor [cefaclor] Rash   Hydrochlorothiazide Rash   Latex Rash   Contact dermatitis   Relafen [nabumetone] Rash   Also, no NSAIDs due to kidney function   Sulfa Antibiotics Rash        Medication List     TAKE these medications    acetaminophen 500 MG tablet Commonly known as: TYLENOL Take 500-1,000 mg by mouth every 6 (six) hours as needed for mild pain or headache.   amLODipine 10 MG tablet Commonly known as: NORVASC Take 10 mg by mouth daily.   calcium carbonate 500 MG  chewable tablet Commonly known as: TUMS - dosed in mg elemental calcium Chew 3 tablets by mouth at bedtime.   cetirizine 10 MG tablet Commonly known as: ZYRTEC Take 10 mg by mouth at bedtime.   desvenlafaxine 50 MG 24 hr tablet Commonly known as: PRISTIQ Take 50 mg by mouth daily.   docusate sodium 100 MG capsule Commonly known as: Colace Take 1 capsule (100 mg total) by mouth 2 (two) times daily. What changed:  when to take this reasons to take this   Estradiol 10 MCG Tabs vaginal tablet Place 10 mcg vaginally 2 (two) times a week.   estrogens-methylTEST 1.25-2.5 MG tablet Commonly known as: ESTRATEST Take 1 tablet by mouth daily.   famotidine 20 MG tablet Commonly known as: PEPCID Take 20 mg by mouth 2 (two) times daily.   labetalol 100 MG tablet Commonly known as: NORMODYNE Take 100 mg by mouth 2 (two) times daily.   lamoTRIgine 150 MG tablet Commonly known as: LAMICTAL Take 150 mg by mouth at bedtime.   mometasone 50 MCG/ACT nasal spray Commonly known as: NASONEX Place 2 sprays into the nose every morning.   oxyCODONE-acetaminophen 5-325 MG tablet Commonly known as: Percocet Take 1 tablet by mouth every 6 (six) hours as needed for severe pain.   potassium chloride SA 20 MEQ tablet Commonly known as: KLOR-CON M Take 1 tablet (20 mEq total) by mouth daily for 5 days.   promethazine 25 MG tablet Commonly known as: PHENERGAN Take 25 mg by mouth every 8 (eight) hours as needed for nausea/vomiting.   rosuvastatin 10 MG tablet Commonly known as: CRESTOR Take 10 mg by mouth at bedtime.   Vitamin D 50 MCG (2000 UT) tablet Take 4,000 Units by mouth daily.               Discharge Care Instructions  (From admission, onward)           Start     Ordered   02/27/21 0000  Discharge wound care:       Comments: Abdominal wound care   02/27/21 1057            Follow-up Information     Surgery, [place]. Go on 03/09/2021.   Specialty:  General Surgery Why: 2:00 PM  for staple removal. This is a nurse visit only. Please arrive 30 min prior to appointment time. Bring photo ID and insurance information. Contact information: [address] [place] [address] [phone]         Kinsinger, Arta Bruce, MD. Go on 04/01/2021.   Specialty: General Surgery Why: 11:00 AM. Please arrive 15 min prior to appointment time. Bring photo ID and insurance information with you. Contact information: [place] [address] [phone]         Clarene Essex, MD Follow up.   Specialty: Gastroenterology Why: call to arrange follow up with GI for new diagnosis of Chron's disease Contact information: [address] [place] [address] [phone]                  Time coordinating discharge: 39 minutes  Signed:  Virgene Tirone  [facility] 02/27/2021, 11:27 AM

## 2021-02-27 NOTE — TOC Transition Note (Signed)
Transition of Care [facility]) - CM/SW Discharge Note   Patient Details  Name: Jacqueline Riggs MRN: [id number] Date of Birth: Jul 29, 1955  [facility] CM/SW Contact:  Golda Acre, RN Phone Number: 02/27/2021, 11:00 AM   Clinical Narrative:    Dcd to home with no toc needs    Final next level of care: Home/Self Care Barriers to Discharge: No Barriers Identified   Patient Goals and CMS Choice Patient states their goals for this hospitalization and ongoing recovery are:: to go home CMS Medicare.gov Compare Post Acute Care list provided to:: Patient    Discharge Placement                       Discharge Plan and Services   Discharge Planning Services: CM Consult                                 Social Determinants of Health (SDOH) Interventions     Readmission Risk Interventions No flowsheet data found.

## 2021-03-05 DIAGNOSIS — R911 Solitary pulmonary nodule: Secondary | ICD-10-CM | POA: Diagnosis not present

## 2021-03-05 DIAGNOSIS — K56609 Unspecified intestinal obstruction, unspecified as to partial versus complete obstruction: Secondary | ICD-10-CM | POA: Diagnosis not present

## 2021-03-13 DIAGNOSIS — R918 Other nonspecific abnormal finding of lung field: Secondary | ICD-10-CM | POA: Diagnosis not present

## 2021-03-31 DIAGNOSIS — F331 Major depressive disorder, recurrent, moderate: Secondary | ICD-10-CM | POA: Diagnosis not present

## 2021-03-31 DIAGNOSIS — F411 Generalized anxiety disorder: Secondary | ICD-10-CM | POA: Diagnosis not present

## 2021-04-01 ENCOUNTER — Other Ambulatory Visit: Payer: Self-pay | Admitting: Gastroenterology

## 2021-04-01 DIAGNOSIS — K563 Gallstone ileus: Secondary | ICD-10-CM | POA: Diagnosis not present

## 2021-04-01 DIAGNOSIS — K529 Noninfective gastroenteritis and colitis, unspecified: Secondary | ICD-10-CM | POA: Diagnosis not present

## 2021-04-21 DIAGNOSIS — R944 Abnormal results of kidney function studies: Secondary | ICD-10-CM | POA: Diagnosis not present

## 2021-04-29 DIAGNOSIS — Z01419 Encounter for gynecological examination (general) (routine) without abnormal findings: Secondary | ICD-10-CM | POA: Diagnosis not present

## 2021-04-29 DIAGNOSIS — Z1231 Encounter for screening mammogram for malignant neoplasm of breast: Secondary | ICD-10-CM | POA: Diagnosis not present

## 2021-04-29 DIAGNOSIS — R319 Hematuria, unspecified: Secondary | ICD-10-CM | POA: Diagnosis not present

## 2021-04-30 DIAGNOSIS — Z1151 Encounter for screening for human papillomavirus (HPV): Secondary | ICD-10-CM | POA: Diagnosis not present

## 2021-04-30 DIAGNOSIS — Z1272 Encounter for screening for malignant neoplasm of vagina: Secondary | ICD-10-CM | POA: Diagnosis not present

## 2021-05-11 DIAGNOSIS — K529 Noninfective gastroenteritis and colitis, unspecified: Secondary | ICD-10-CM | POA: Diagnosis not present

## 2021-05-13 DIAGNOSIS — R935 Abnormal findings on diagnostic imaging of other abdominal regions, including retroperitoneum: Secondary | ICD-10-CM | POA: Diagnosis not present

## 2021-05-21 DIAGNOSIS — Z13820 Encounter for screening for osteoporosis: Secondary | ICD-10-CM | POA: Diagnosis not present

## 2021-06-30 DIAGNOSIS — F331 Major depressive disorder, recurrent, moderate: Secondary | ICD-10-CM | POA: Diagnosis not present

## 2021-06-30 DIAGNOSIS — F411 Generalized anxiety disorder: Secondary | ICD-10-CM | POA: Diagnosis not present

## 2021-07-31 DIAGNOSIS — L989 Disorder of the skin and subcutaneous tissue, unspecified: Secondary | ICD-10-CM | POA: Diagnosis not present

## 2021-07-31 DIAGNOSIS — L02212 Cutaneous abscess of back [any part, except buttock]: Secondary | ICD-10-CM | POA: Diagnosis not present

## 2021-09-24 DIAGNOSIS — H5213 Myopia, bilateral: Secondary | ICD-10-CM | POA: Diagnosis not present

## 2021-09-24 DIAGNOSIS — H2513 Age-related nuclear cataract, bilateral: Secondary | ICD-10-CM | POA: Diagnosis not present

## 2021-09-29 DIAGNOSIS — F411 Generalized anxiety disorder: Secondary | ICD-10-CM | POA: Diagnosis not present

## 2021-09-29 DIAGNOSIS — F331 Major depressive disorder, recurrent, moderate: Secondary | ICD-10-CM | POA: Diagnosis not present

## 2021-10-30 DIAGNOSIS — E782 Mixed hyperlipidemia: Secondary | ICD-10-CM | POA: Diagnosis not present

## 2021-10-30 DIAGNOSIS — I10 Essential (primary) hypertension: Secondary | ICD-10-CM | POA: Diagnosis not present

## 2021-10-30 DIAGNOSIS — Z0000 Encounter for general adult medical examination without abnormal findings: Secondary | ICD-10-CM | POA: Diagnosis not present

## 2021-10-30 DIAGNOSIS — E559 Vitamin D deficiency, unspecified: Secondary | ICD-10-CM | POA: Diagnosis not present

## 2021-10-30 DIAGNOSIS — Z23 Encounter for immunization: Secondary | ICD-10-CM | POA: Diagnosis not present

## 2021-11-17 DIAGNOSIS — E8889 Other specified metabolic disorders: Secondary | ICD-10-CM | POA: Diagnosis not present

## 2021-11-17 DIAGNOSIS — E559 Vitamin D deficiency, unspecified: Secondary | ICD-10-CM | POA: Diagnosis not present

## 2021-11-17 DIAGNOSIS — E782 Mixed hyperlipidemia: Secondary | ICD-10-CM | POA: Diagnosis not present

## 2021-11-17 DIAGNOSIS — Z1389 Encounter for screening for other disorder: Secondary | ICD-10-CM | POA: Diagnosis not present

## 2021-11-18 ENCOUNTER — Other Ambulatory Visit (HOSPITAL_COMMUNITY): Payer: Self-pay

## 2021-11-18 MED ORDER — WEGOVY 0.25 MG/0.5ML ~~LOC~~ SOAJ
0.2500 mg | SUBCUTANEOUS | 0 refills | Status: DC
  Filled 2021-11-18: qty 2, 28d supply, fill #0

## 2021-11-18 MED ORDER — WEGOVY 1 MG/0.5ML ~~LOC~~ SOAJ
1.0000 mg | SUBCUTANEOUS | 0 refills | Status: DC
  Filled 2021-11-18: qty 2, 28d supply, fill #0

## 2021-11-18 MED ORDER — WEGOVY 0.5 MG/0.5ML ~~LOC~~ SOAJ
0.5000 mg | SUBCUTANEOUS | 0 refills | Status: DC
  Filled 2021-11-18: qty 2, 28d supply, fill #0

## 2021-12-08 DIAGNOSIS — M150 Primary generalized (osteo)arthritis: Secondary | ICD-10-CM | POA: Diagnosis not present

## 2021-12-08 DIAGNOSIS — F331 Major depressive disorder, recurrent, moderate: Secondary | ICD-10-CM | POA: Diagnosis not present

## 2021-12-29 DIAGNOSIS — E559 Vitamin D deficiency, unspecified: Secondary | ICD-10-CM | POA: Diagnosis not present

## 2021-12-29 DIAGNOSIS — F331 Major depressive disorder, recurrent, moderate: Secondary | ICD-10-CM | POA: Diagnosis not present

## 2021-12-29 DIAGNOSIS — F411 Generalized anxiety disorder: Secondary | ICD-10-CM | POA: Diagnosis not present

## 2022-01-19 DIAGNOSIS — M1811 Unilateral primary osteoarthritis of first carpometacarpal joint, right hand: Secondary | ICD-10-CM | POA: Diagnosis not present

## 2022-01-20 DIAGNOSIS — I10 Essential (primary) hypertension: Secondary | ICD-10-CM | POA: Diagnosis not present

## 2022-01-20 DIAGNOSIS — M150 Primary generalized (osteo)arthritis: Secondary | ICD-10-CM | POA: Diagnosis not present

## 2022-02-17 DIAGNOSIS — M150 Primary generalized (osteo)arthritis: Secondary | ICD-10-CM | POA: Diagnosis not present

## 2022-02-17 DIAGNOSIS — I10 Essential (primary) hypertension: Secondary | ICD-10-CM | POA: Diagnosis not present

## 2022-03-08 DIAGNOSIS — I10 Essential (primary) hypertension: Secondary | ICD-10-CM | POA: Diagnosis not present

## 2022-03-11 DIAGNOSIS — I10 Essential (primary) hypertension: Secondary | ICD-10-CM | POA: Diagnosis not present

## 2022-03-11 DIAGNOSIS — M150 Primary generalized (osteo)arthritis: Secondary | ICD-10-CM | POA: Diagnosis not present

## 2022-03-20 ENCOUNTER — Emergency Department (HOSPITAL_BASED_OUTPATIENT_CLINIC_OR_DEPARTMENT_OTHER): Payer: Federal, State, Local not specified - PPO

## 2022-03-20 ENCOUNTER — Other Ambulatory Visit: Payer: Self-pay

## 2022-03-20 ENCOUNTER — Encounter (HOSPITAL_BASED_OUTPATIENT_CLINIC_OR_DEPARTMENT_OTHER): Payer: Self-pay | Admitting: Emergency Medicine

## 2022-03-20 ENCOUNTER — Emergency Department (HOSPITAL_BASED_OUTPATIENT_CLINIC_OR_DEPARTMENT_OTHER)
Admission: EM | Admit: 2022-03-20 | Discharge: 2022-03-20 | Disposition: A | Discharge status: Home / Self Care | Payer: Federal, State, Local not specified - PPO | Attending: Emergency Medicine | Admitting: Emergency Medicine

## 2022-03-20 DIAGNOSIS — W01198A Fall on same level from slipping, tripping and stumbling with subsequent striking against other object, initial encounter: Secondary | ICD-10-CM | POA: Insufficient documentation

## 2022-03-20 DIAGNOSIS — Z91040 Latex allergy status: Secondary | ICD-10-CM | POA: Diagnosis not present

## 2022-03-20 DIAGNOSIS — S0990XA Unspecified injury of head, initial encounter: Secondary | ICD-10-CM | POA: Diagnosis not present

## 2022-03-20 DIAGNOSIS — W19XXXA Unspecified fall, initial encounter: Secondary | ICD-10-CM

## 2022-03-20 DIAGNOSIS — S0003XA Contusion of scalp, initial encounter: Secondary | ICD-10-CM | POA: Diagnosis not present

## 2022-03-20 DIAGNOSIS — S199XXA Unspecified injury of neck, initial encounter: Secondary | ICD-10-CM | POA: Diagnosis not present

## 2022-03-20 DIAGNOSIS — S0083XA Contusion of other part of head, initial encounter: Secondary | ICD-10-CM | POA: Diagnosis not present

## 2022-03-20 NOTE — ED Provider Notes (Signed)
[facility]  Provider Note  CSN: [id number] Arrival date & time: 03/20/22 0116  History Chief Complaint  Patient presents with   Head Injury    Jacqueline Riggs is a 67 y.o. 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 She reports she was getting out of bed when she lost her balance, fell and hit her head on the night stand. No LOC but had one episode of vomiting. No confusion or difficulty walking. Not on a blood thinner.    Home Medications Prior to Admission medications   Medication Sig Start Date End Date Taking? Authorizing Provider  acetaminophen (TYLENOL) 500 MG tablet Take 500-1,000 mg by mouth every 6 (six) hours as needed for mild pain or headache.    [provider]  amLODipine (NORVASC) 10 MG tablet Take 10 mg by mouth daily. 06/19/18   [provider]  calcium carbonate (TUMS - DOSED IN MG ELEMENTAL CALCIUM) 500 MG chewable tablet Chew 3 tablets by mouth at bedtime.    [provider]  cetirizine (ZYRTEC) 10 MG tablet Take 10 mg by mouth at bedtime.    [provider]  Cholecalciferol (VITAMIN D) 50 MCG (2000 UT) tablet Take 4,000 Units by mouth daily.    [provider]  desvenlafaxine (PRISTIQ) 50 MG 24 hr tablet Take 50 mg by mouth daily. 01/06/21   [provider]  docusate sodium (COLACE) 100 MG capsule Take 1 capsule (100 mg total) by mouth 2 (two) times daily. Patient taking differently: Take 100 mg by mouth 2 (two) times daily as needed for mild constipation. 06/30/18   Gary Fleet, PA-C  Estradiol 10 MCG TABS vaginal tablet Place 10 mcg vaginally 2 (two) times a week. 04/18/18   [provider]  estrogens-methylTEST (ESTRATEST) 1.25-2.5 MG tablet Take 1 tablet by mouth daily. 05/11/18   [provider]  famotidine (PEPCID) 20 MG tablet Take 20 mg by mouth 2 (two) times daily. 06/15/18   [provider]  labetalol (NORMODYNE) 100 MG tablet Take 100 mg by mouth 2 (two) times daily. 04/17/18   [provider]  lamoTRIgine (LAMICTAL) 150 MG tablet Take 150 mg by mouth at bedtime. 04/23/18   [provider]  mometasone (NASONEX) 50 MCG/ACT nasal spray Place 2 sprays into the nose every morning.    [provider]  potassium chloride SA (KLOR-CON M) 20 MEQ tablet Take 1 tablet (20 mEq total) by mouth daily for 5 days. 02/27/21 03/04/21  Pokhrel, Corrie Mckusick, MD  promethazine (PHENERGAN) 25 MG tablet Take 25 mg by mouth every 8 (eight) hours as needed for nausea/vomiting. 02/13/21   [provider]  rosuvastatin (CRESTOR) 10 MG tablet Take 10 mg by mouth at bedtime. 06/15/18   [provider]  Semaglutide-Weight Management (WEGOVY) 0.25 MG/0.5ML SOAJ Inject 0.25 mg into the skin once a week. 11/18/21     Semaglutide-Weight Management (WEGOVY) 0.5 MG/0.5ML SOAJ Inject 0.5 mg into the skin once a week. 11/18/21     Semaglutide-Weight Management (WEGOVY) 1 MG/0.5ML SOAJ Inject 1 mg into the skin once a week. 11/18/21        Allergies    Penicillins, Dexilant [dexlansoprazole], Prilosec [omeprazole], Shellfish allergy, Ceclor [cefaclor], Hydrochlorothiazide, Latex, Relafen [nabumetone], and Sulfa antibiotics   Review of Systems   Review of Systems Please see HPI for pertinent positives and negatives  Physical Exam BP 130/73 (BP Location: Right Arm)  Pulse (!) 54   Temp 97.6 F (36.4 C) (Oral)   Resp 18   SpO2 97%   Physical Exam Vitals and nursing note reviewed.  Constitutional:      Appearance: Normal appearance.  HENT:     Head: Normocephalic.     Comments: Large hematoma R forehead    Nose: Nose normal.     Mouth/Throat:     Mouth: Mucous membranes are moist.  Eyes:     Extraocular Movements: Extraocular movements intact.     Conjunctiva/sclera: Conjunctivae normal.  Cardiovascular:     Rate and Rhythm: Normal rate.  Pulmonary:     Effort:  Pulmonary effort is normal.     Breath sounds: Normal breath sounds.  Abdominal:     General: Abdomen is flat.     Palpations: Abdomen is soft.     Tenderness: There is no abdominal tenderness.  Musculoskeletal:        General: No swelling. Normal range of motion.     Cervical back: Neck supple.  Skin:    General: Skin is warm and dry.  Neurological:     General: No focal deficit present.     Mental Status: She is alert.     Gait: Gait normal.  Psychiatric:        Mood and Affect: Mood normal.     ED Results / Procedures / Treatments   EKG None  Procedures Procedures  Medications Ordered in the ED Medications - No data to display  Initial Impression and Plan  Patient here with frontal head injury. Awake and alert now. I personally viewed the images from radiology studies and agree with radiologist interpretation:  CT neg for intracranial or cervical spine injuries. Patient given expectant management for forehead hematoma and head injury precautions discussed with husband at bedside. PCP follow up, RTED for any other concerns.    ED Course       MDM Rules/Calculators/A&P Medical Decision Making Problems Addressed: Fall, initial encounter: acute illness or injury Injury of head, initial encounter: acute illness or injury Traumatic hematoma of forehead, initial encounter: acute illness or injury  Amount and/or Complexity of Data Reviewed Radiology: ordered and independent interpretation performed. Decision-making details documented in ED Course.     Final Clinical Impression(s) / ED Diagnoses Final diagnoses:  Fall, initial encounter  Injury of head, initial encounter  Traumatic hematoma of forehead, initial encounter    Rx / DC Orders ED Discharge Orders     None        Truddie Hidden, MD 03/20/22 [phone]

## 2022-03-20 NOTE — ED Triage Notes (Signed)
Fall from bed hit head on night stand and night of drinking. Lump on forehead. Denies loc. Vomiting after No blood thinners Happened around 1am  Gcs 15 aox4

## 2022-03-30 DIAGNOSIS — F331 Major depressive disorder, recurrent, moderate: Secondary | ICD-10-CM | POA: Diagnosis not present

## 2022-03-30 DIAGNOSIS — F411 Generalized anxiety disorder: Secondary | ICD-10-CM | POA: Diagnosis not present

## 2022-03-31 DIAGNOSIS — I10 Essential (primary) hypertension: Secondary | ICD-10-CM | POA: Diagnosis not present

## 2022-03-31 DIAGNOSIS — M150 Primary generalized (osteo)arthritis: Secondary | ICD-10-CM | POA: Diagnosis not present

## 2022-04-28 DIAGNOSIS — Z9189 Other specified personal risk factors, not elsewhere classified: Secondary | ICD-10-CM | POA: Diagnosis not present

## 2022-04-28 DIAGNOSIS — I10 Essential (primary) hypertension: Secondary | ICD-10-CM | POA: Diagnosis not present

## 2022-05-26 DIAGNOSIS — I10 Essential (primary) hypertension: Secondary | ICD-10-CM | POA: Diagnosis not present

## 2022-05-26 DIAGNOSIS — Z9189 Other specified personal risk factors, not elsewhere classified: Secondary | ICD-10-CM | POA: Diagnosis not present

## 2022-05-26 DIAGNOSIS — E669 Obesity, unspecified: Secondary | ICD-10-CM | POA: Diagnosis not present

## 2022-06-22 DIAGNOSIS — Z9189 Other specified personal risk factors, not elsewhere classified: Secondary | ICD-10-CM | POA: Diagnosis not present

## 2022-06-22 DIAGNOSIS — I10 Essential (primary) hypertension: Secondary | ICD-10-CM | POA: Diagnosis not present

## 2022-06-29 DIAGNOSIS — F331 Major depressive disorder, recurrent, moderate: Secondary | ICD-10-CM | POA: Diagnosis not present

## 2022-06-29 DIAGNOSIS — F411 Generalized anxiety disorder: Secondary | ICD-10-CM | POA: Diagnosis not present

## 2022-07-15 DIAGNOSIS — I10 Essential (primary) hypertension: Secondary | ICD-10-CM | POA: Diagnosis not present

## 2022-09-15 DIAGNOSIS — Z79899 Other long term (current) drug therapy: Secondary | ICD-10-CM | POA: Diagnosis not present

## 2022-09-15 DIAGNOSIS — Z6830 Body mass index (BMI) 30.0-30.9, adult: Secondary | ICD-10-CM | POA: Diagnosis not present

## 2022-09-15 DIAGNOSIS — I10 Essential (primary) hypertension: Secondary | ICD-10-CM | POA: Diagnosis not present

## 2022-09-15 DIAGNOSIS — E669 Obesity, unspecified: Secondary | ICD-10-CM | POA: Diagnosis not present

## 2022-09-15 DIAGNOSIS — Z9189 Other specified personal risk factors, not elsewhere classified: Secondary | ICD-10-CM | POA: Diagnosis not present

## 2022-09-22 DIAGNOSIS — F331 Major depressive disorder, recurrent, moderate: Secondary | ICD-10-CM | POA: Diagnosis not present

## 2022-09-22 DIAGNOSIS — F411 Generalized anxiety disorder: Secondary | ICD-10-CM | POA: Diagnosis not present

## 2022-09-29 DIAGNOSIS — Z1231 Encounter for screening mammogram for malignant neoplasm of breast: Secondary | ICD-10-CM | POA: Diagnosis not present

## 2022-09-29 DIAGNOSIS — Z01419 Encounter for gynecological examination (general) (routine) without abnormal findings: Secondary | ICD-10-CM | POA: Diagnosis not present

## 2022-09-30 ENCOUNTER — Other Ambulatory Visit (HOSPITAL_COMMUNITY): Payer: Self-pay | Admitting: Obstetrics and Gynecology

## 2022-09-30 DIAGNOSIS — Z8249 Family history of ischemic heart disease and other diseases of the circulatory system: Secondary | ICD-10-CM

## 2022-10-07 ENCOUNTER — Ambulatory Visit (HOSPITAL_COMMUNITY)
Admission: RE | Admit: 2022-10-07 | Discharge: 2022-10-07 | Disposition: A | Discharge status: Home / Self Care | Payer: Self-pay | Source: Ambulatory Visit | Attending: Obstetrics and Gynecology | Admitting: Obstetrics and Gynecology

## 2022-10-07 DIAGNOSIS — Z8249 Family history of ischemic heart disease and other diseases of the circulatory system: Secondary | ICD-10-CM | POA: Insufficient documentation

## 2022-10-07 DIAGNOSIS — I2510 Atherosclerotic heart disease of native coronary artery without angina pectoris: Secondary | ICD-10-CM | POA: Diagnosis not present

## 2022-10-07 DIAGNOSIS — I77810 Thoracic aortic ectasia: Secondary | ICD-10-CM | POA: Diagnosis not present

## 2022-10-13 DIAGNOSIS — E669 Obesity, unspecified: Secondary | ICD-10-CM | POA: Diagnosis not present

## 2022-10-13 DIAGNOSIS — I10 Essential (primary) hypertension: Secondary | ICD-10-CM | POA: Diagnosis not present

## 2022-10-13 DIAGNOSIS — Z9189 Other specified personal risk factors, not elsewhere classified: Secondary | ICD-10-CM | POA: Diagnosis not present

## 2022-10-22 DIAGNOSIS — H2513 Age-related nuclear cataract, bilateral: Secondary | ICD-10-CM | POA: Diagnosis not present

## 2022-10-23 IMAGING — CT CT FOOT*R* W/O CM
3 series · 9 of 33 positions shown, 11 images · non-contrast
Comparison: None.

CLINICAL DATA: Right foot pain for 9 weeks

EXAM:
CT OF THE RIGHT FOOT WITHOUT CONTRAST
TECHNIQUE: Multidetector CT imaging of the right foot was performed according
to the standard protocol. Multiplanar CT image reconstructions were
also generated.

[Series 5: sfov lower extremity 2.00 br40 s3 soft · axial · 0.22mm/px · z∈[+523,+523]mm · 1 of 62 slices shown, 2 images (1 of 3)]
[im 33/62  soft-tissue]
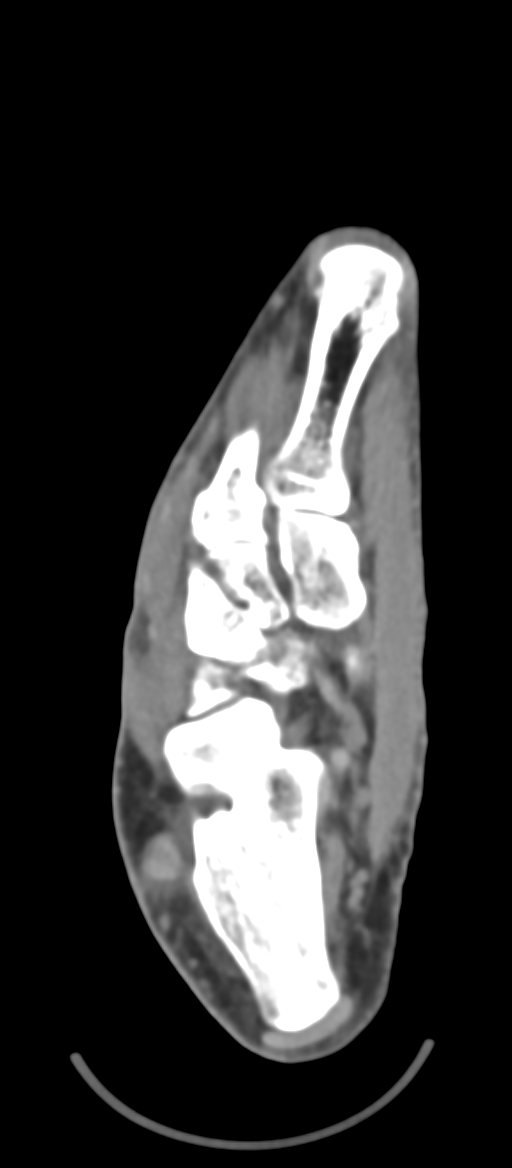
[im 33/62  bone]
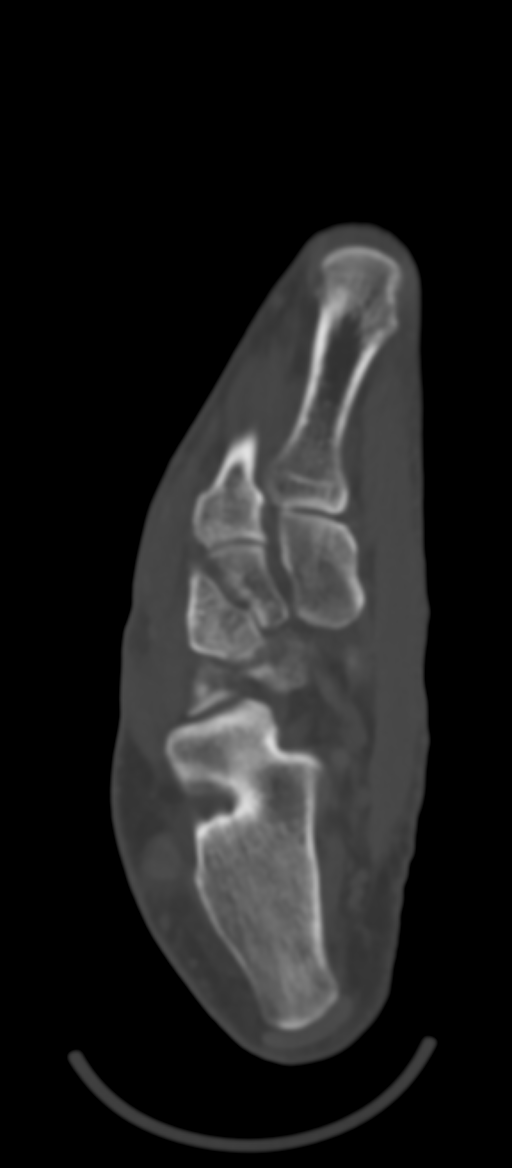

[Series 9: sfov lower extremity 2.00 br40 s3 soft · coronal · 0.22mm/px · 3 of 135 slices shown (2 of 3)]
[im 27/135  bone]
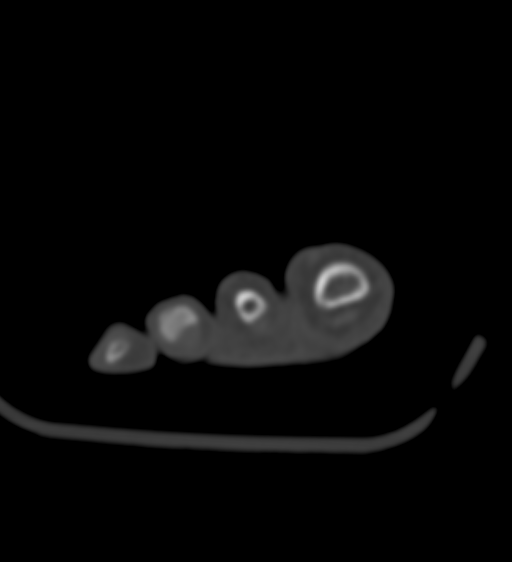
[im 54/135  bone]
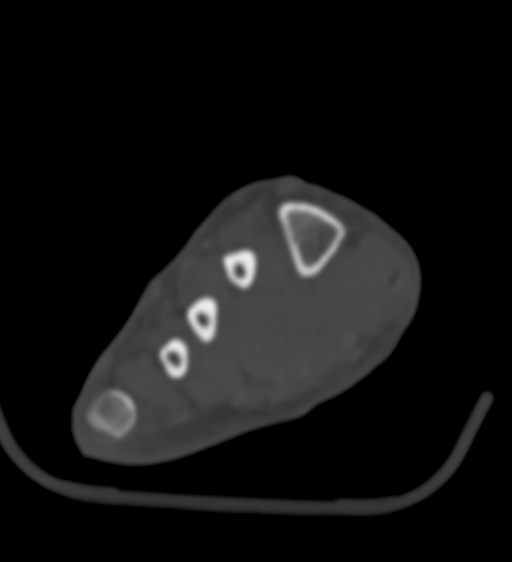
[im 81/135  bone]
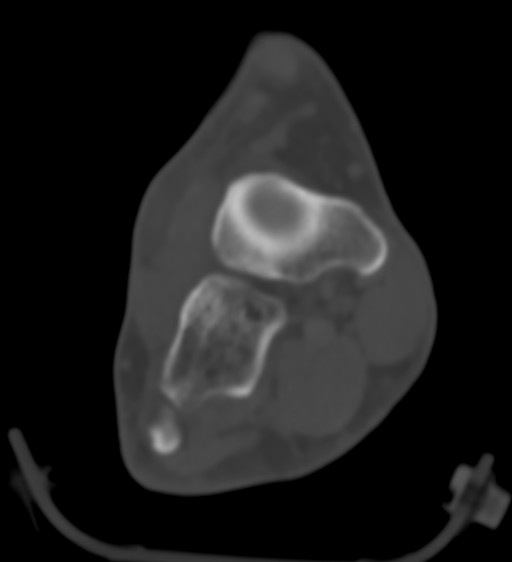

[Series 13: sfov lower extremity 2.00 br40 s3 soft · sagittal · 0.24mm/px · 5 of 56 slices shown, 6 images (3 of 3)]
[im 19/56  bone]
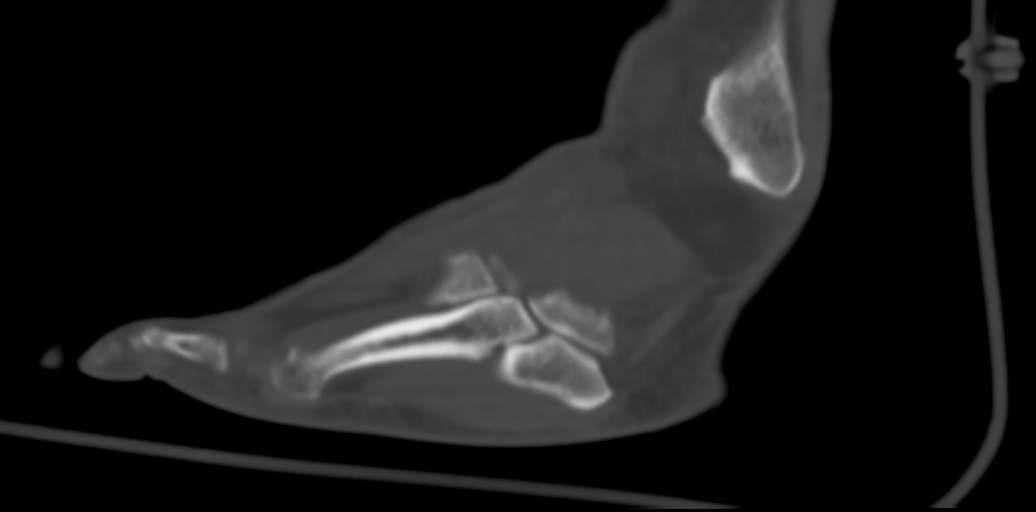
[im 23/56  bone]
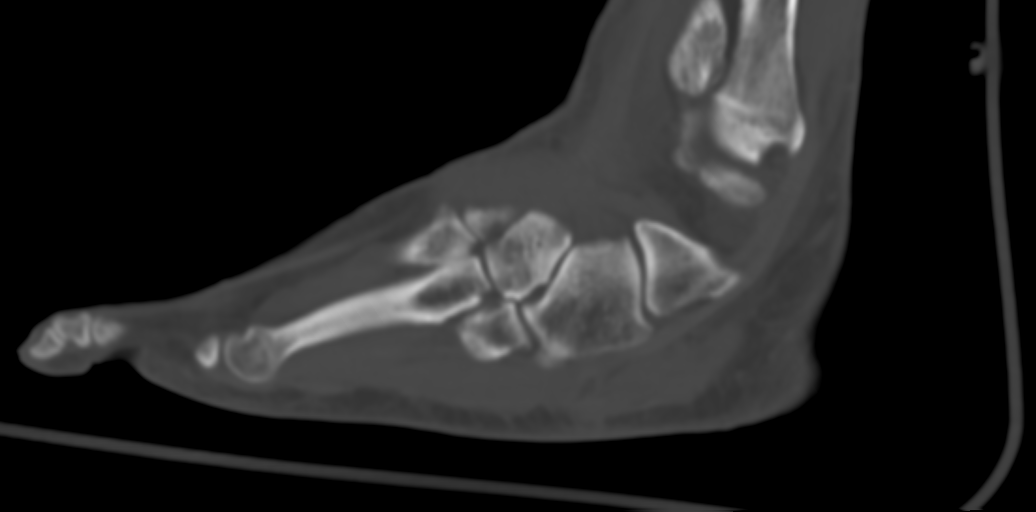
[im 28/56  soft-tissue]
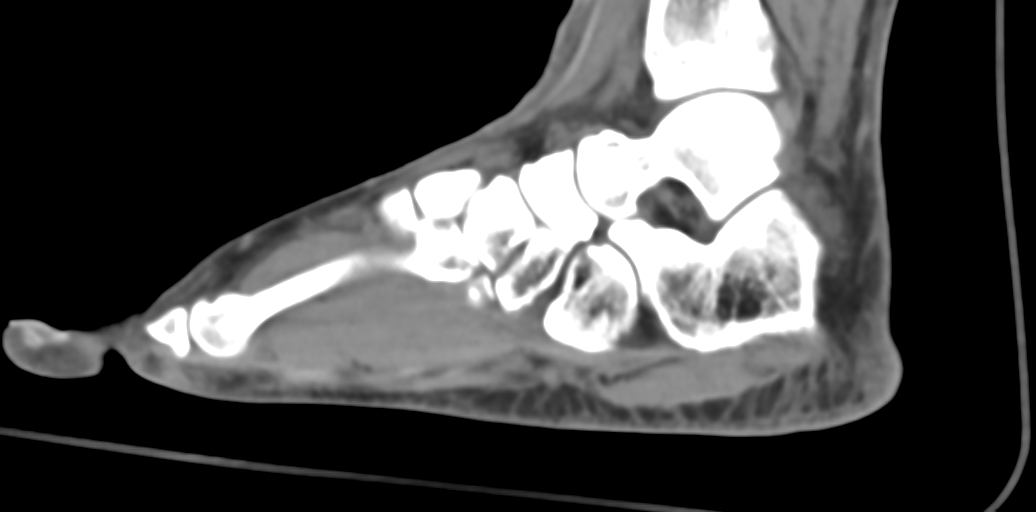
[im 28/56  bone]
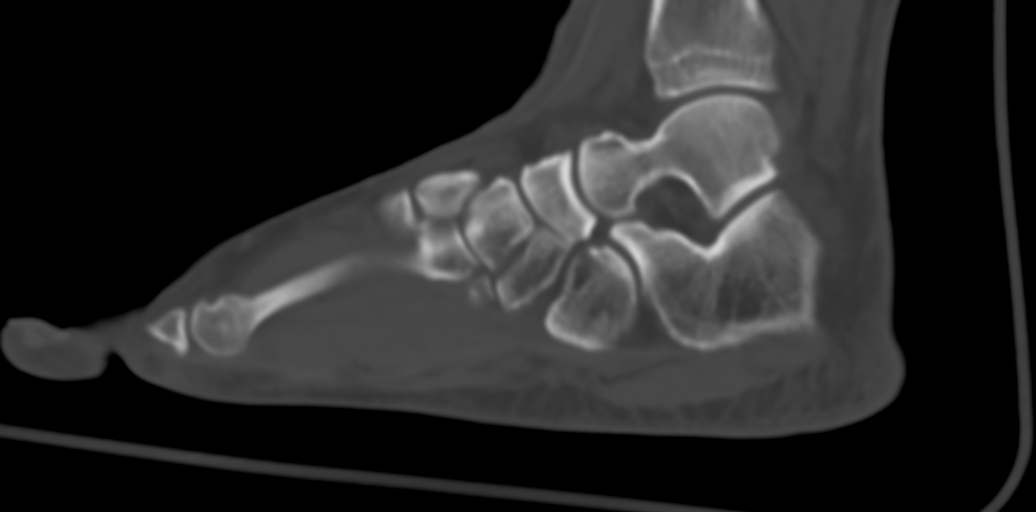
[im 33/56  bone]
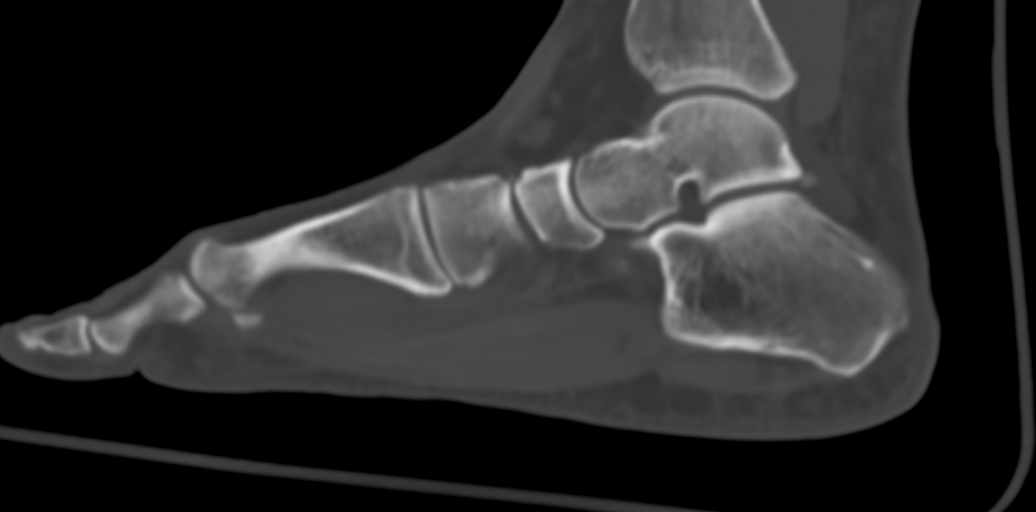
[im 37/56  bone]
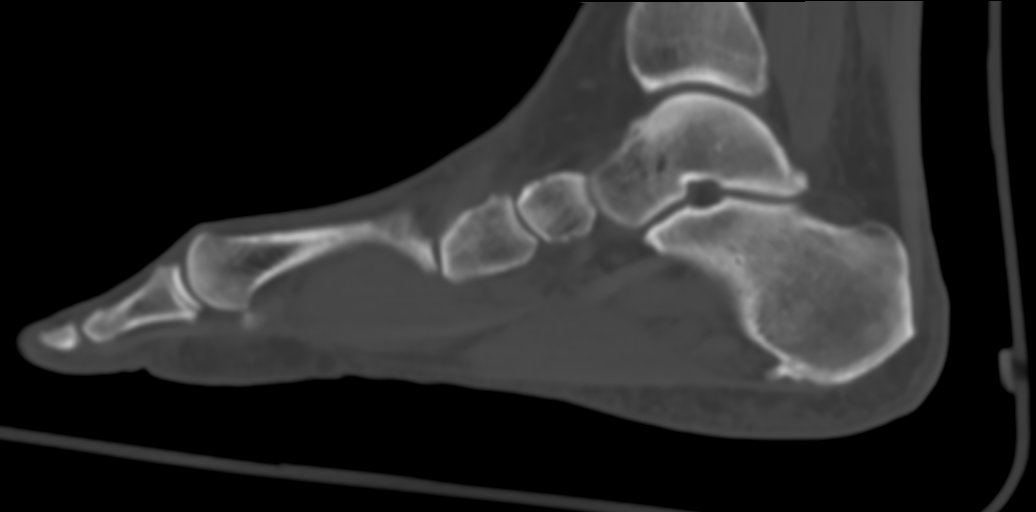

[9 of 33 positions shown; findings below may reference images not displayed]

FINDINGS: Bones/Joint/Cartilage

Plantar calcaneal spur. Mild sclerosis in the first digit sesamoids,
cannot exclude sesamoiditis. Small accessory navicular.

Plafond and talar dome appear unremarkable.

Ligaments

Suboptimally assessed by CT.

Muscles and Tendons

Extensor digitorum longus tenosynovitis, tendinopathy, and possibly
partial tearing. Minimal tibialis anterior tenosynovitis.

Soft tissues

Low-grade subcutaneous edema along the heel.
IMPRESSION: 1. Extensor digitorum longus tenosynovitis, tendinopathy, and
possibly partial tendon tearing. Minimal tibialis anterior
tenosynovitis.
2. Low-grade subcutaneous edema along the heel.
3. Mild sclerosis in the first digit sesamoids, cannot exclude
sesamoiditis.
4. Plantar calcaneal spur.

## 2022-10-27 ENCOUNTER — Ambulatory Visit (HOSPITAL_BASED_OUTPATIENT_CLINIC_OR_DEPARTMENT_OTHER): Payer: Federal, State, Local not specified - PPO | Admitting: Nurse Practitioner

## 2022-10-27 ENCOUNTER — Encounter (HOSPITAL_BASED_OUTPATIENT_CLINIC_OR_DEPARTMENT_OTHER): Payer: Self-pay | Admitting: Nurse Practitioner

## 2022-10-27 VITALS — BP 140/82 | HR 58 | Ht 62.0 in | Wt 172.0 lb

## 2022-10-27 DIAGNOSIS — Z7189 Other specified counseling: Secondary | ICD-10-CM

## 2022-10-27 DIAGNOSIS — I77819 Aortic ectasia, unspecified site: Secondary | ICD-10-CM | POA: Diagnosis not present

## 2022-10-27 DIAGNOSIS — E785 Hyperlipidemia, unspecified: Secondary | ICD-10-CM | POA: Diagnosis not present

## 2022-10-27 DIAGNOSIS — I2510 Atherosclerotic heart disease of native coronary artery without angina pectoris: Secondary | ICD-10-CM

## 2022-10-27 DIAGNOSIS — I10 Essential (primary) hypertension: Secondary | ICD-10-CM | POA: Diagnosis not present

## 2022-10-27 DIAGNOSIS — I2584 Coronary atherosclerosis due to calcified coronary lesion: Secondary | ICD-10-CM

## 2022-10-27 NOTE — Progress Notes (Unsigned)
Cardiology Office Note:  .   Date:  10/28/2022 ID:  Jacqueline Riggs, DOB 27-Nov-1955, MRN [id number] PCP: Henrine Screws, MD [facility] Providers Cardiologist:  None   Patient Profile: .      PMH Elevated coronary calcium score 33.5 (63rd percentile) Hypertension Family history Father - history of MI, stroke - died from complications including colon and kidney problems Younger brother - valve replacement and AICD Mother -  strokes, died in her 9s 2 brothers died from strokes Former tobacco abuse       History of Present Illness: .   Jacqueline Riggs is a pleasant 67 y.o. female  who is here today for new patient consult for elevated coronary calcium score. Is concerned about the history of strokes in her family. She reports she is feeling well. 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 Her job involves some standing/walking and some sitting. She plays the horn and only time she has experienced shortness of breath was when playing outside in extreme heat. She and her husband have been working on following a better diet for his diabetes. They met with a nutritionist.  She was previously going to classes at her gym but this no longer fits her schedule so she has been taking extra laps inside the stores when shopping for exercise. History of high cholesterol, has been followed by PCP. She is scheduled for follow-up lab work with PCP in 2 weeks. She denies chest pain, shortness of breath, lower extremity edema, fatigue, palpitations, melena, presyncope, syncope, orthopnea, and PND.    Family history: Her family history includes Heart attack in her father; Stroke in her brother, brother, father, and mother.   ASCVD Risk Score:ASCVD (Atherosclerotic Cardiovascular Disease) Risk Algorithm including Known ASCVD from AHA/ACC from [email or web address]  on 10/27/2022  RESULT SUMMARY: 22.0 % Risk of cardiovascular event (coronary or stroke death or  non-fatal MI or stroke) in next 10 years.  INPUTS: History of ASCVD --> 0 = No LDL Cholesterol >=190mg /dL (2.95 mmol/L) --> 0 = No Age --> 67 years Diabetes --> 0 = No Sex --> 0 = Female Total Cholesterol --> 126 mg/dL HDL Cholesterol --> 46 mg/dL Systolic Blood Pressure --> 150 mm Hg Treatment for Hypertension --> 1 = Yes Smoker --> 1 = Yes Race --> 1 = White  The ASCVD Risk score (Arnett DK, et al., 2019) failed to calculate for the following reasons:   Cannot find a previous HDL lab   Cannot find a previous total cholesterol lab    Diet: Tries to eat diabetic diet, met with nutritionist Limiting carbohydrates Fried pork chop in oil and butter, chopped salad, red and brown quinoa  Eats fruit and yogurt but also eats biscuits and hash browns   Activity: Walking for exercise with extra laps around the inside of stores when shopping Has a gym membership but does not use it, time of class she enjoys does not coordinate   ROS: See HPI       Studies Reviewed: Marland Kitchen   EKG Interpretation Date/Time:  Wednesday October 27 2022 14:50:08 EDT Ventricular Rate:  58 PR Interval:  152 QRS Duration:  94 QT Interval:  408 QTC Calculation: 400 R Axis:   74  Text Interpretation: Sinus bradycardia When compared with ECG of 26-Jun-2018 15:06, No significant change was found No ST abnormality Confirmed by Eligha Bridegroom [phone]) on 10/27/2022 2:55:24 PM  Risk Assessment/Calculations:          Physical Exam:   VS: BP (!) 140/82   Pulse (!) 58   Ht 5\' 2"  (1.575 m)   Wt 172 lb (78 kg)   BMI 31.46 kg/m   Wt Readings from Last 3 Encounters:  10/27/22 172 lb (78 kg)  02/22/21 197 lb (89.4 kg)  06/30/18 204 lb 8 oz (92.8 kg)     GEN: Well nourished, well developed in no acute distress NECK: No JVD; No carotid bruits CARDIAC: RRR, no murmurs, rubs, gallops RESPIRATORY:  Clear to auscultation without rales, wheezing or rhonchi  ABDOMEN: Soft, non-tender,  non-distended EXTREMITIES:  No edema; No deformity     ASSESSMENT AND PLAN: .    Elevated coronary artery calcium score: CT calcium score 33.5 Agatston units (63rd percentile) completed on 10/07/22. We discussed these findings in detail. She denies chest pain, dyspnea, or other symptoms concerning for angina.  No indication for further ischemic evaluation at this time.  Secondary prevention emphasized including 150 minutes of moderate intensity exercise each week along with heart healthy diet avoiding saturated fat, processed foods, simple carbohydrates, and sugar. Continue rosuvastatin.   Aortic dilatation: Mild dilatation of the ascending aorta at 4 cm on CT 10/07/22. We will repeat imaging in 1 year.  Hypertension: BP mildly elevated. She admits she is anxious and that BP is usually better controlled. She has follow-up with PCP in 2 weeks. No medication changes today.   Dyslipidemia: Last lipid panel to review from 10/30/2021:  LDL 64, HDL 46, TG 82. Is scheduled for repeat testing in 2 weeks. I have asked her to send [place] results. Continue rosuvastatin.   CV Risk Assessment: ASCVD risk score 22%.We discussed the components of her risk score. She no longer smokes. Encouraged her to ensure good BP control.   Plan/Goals: Heart healthy diet, avoiding saturated fat Limit biscuits and hashbrowns Increase exercise to achieve at least 30  minutes of sustained activity 5 days per week       Dispo: 6 months with me  Signed, Eligha Bridegroom, NP-C

## 2022-10-27 NOTE — Patient Instructions (Signed)
Medication Instructions:   Your physician recommends that you continue on your current medications as directed. Please refer to the Current Medication list given to you today.   *If you need a refill on your cardiac medications before your next appointment, please call your pharmacy*   Lab Work:  None ordered.  If you have labs (blood work) drawn today and your tests are completely normal, you will receive your results only by: MyChart Message (if you have MyChart) OR A paper copy in the mail If you have any lab test that is abnormal or we need to change your treatment, we will call you to review the results.   Testing/Procedures:  None ordered.   Follow-Up: At [facility], you and your health needs are our priority.  As part of our continuing mission to provide you with exceptional heart care, we have created designated Provider Care Teams.  These Care Teams include your primary Cardiologist (physician) and Advanced Practice Providers (APPs -  Physician Assistants and Nurse Practitioners) who all work together to provide you with the care you need, when you need it.  We recommend signing up for the patient portal called "MyChart".  Sign up information is provided on this After Visit Summary.  MyChart is used to connect with patients for Virtual Visits (Telemedicine).  Patients are able to view lab/test results, encounter notes, upcoming appointments, etc.  Non-urgent messages can be sent to your provider as well.   To learn more about what you can do with MyChart, go to [email or web address].    Your next appointment:   6 month(s)  Provider:   Eligha Bridegroom, NP    Other Instructions   Adopting a Healthy Lifestyle.   Weight: Know what a healthy weight is for you (roughly BMI <25) and aim to maintain this. You can calculate your body mass index on your smart phone. Unfortunately, this is not the most accurate measure of healthy weight, but it is the simplest  measurement to use. A more accurate measurement involves body scanning which measures lean muscle, fat tissue and bony density. We do not have this equipment at [facility].    Diet: Aim for 7+ servings of fruits and vegetables daily Limit animal fats in diet for cholesterol and heart health - choose grass fed whenever available Avoid highly processed foods (fast food burgers, tacos, fried chicken, pizza, hot dogs, french fries)  Saturated fat comes in the form of butter, lard, coconut oil, margarine, partially hydrogenated oils, and fat in meat. These increase your risk of cardiovascular disease.  Use healthy plant oils, such as olive, canola, soy, corn, sunflower and peanut.  Whole foods such as fruits, vegetables and whole grains have fiber  Men need > 38 grams of fiber per day Women need > 25 grams of fiber per day  Load up on vegetables and fruits - one-half of your plate: Aim for color and variety, and remember that potatoes dont count. Go for whole grains - one-quarter of your plate: Whole wheat, barley, wheat berries, quinoa, oats, brown rice, and foods made with them. If you want pasta, go with whole wheat pasta. Protein power - one-quarter of your plate: Fish, chicken, beans, and nuts are all healthy, versatile protein sources. Limit red meat. You need carbohydrates for energy! The type of carbohydrate is more important than the amount. Choose carbohydrates such as vegetables, fruits, whole grains, beans, and nuts in the place of white rice, white pasta, potatoes (baked or fried), macaroni and cheese, cakes,  cookies, and donuts.  If youre thirsty, drink water. Coffee and tea are good in moderation, but skip sugary drinks and limit milk and dairy products to one or two daily servings. Keep sugar intake at 6 teaspoons or 24 grams or LESS       Exercise: Aim for 150 min of moderate intensity exercise weekly for heart health, and weights twice weekly for bone health Stay active - any steps  are better than no steps! Aim for 7-9 hours of sleep daily

## 2022-10-28 ENCOUNTER — Encounter (HOSPITAL_BASED_OUTPATIENT_CLINIC_OR_DEPARTMENT_OTHER): Payer: Self-pay | Admitting: Nurse Practitioner

## 2022-11-09 DIAGNOSIS — I10 Essential (primary) hypertension: Secondary | ICD-10-CM | POA: Diagnosis not present

## 2022-11-09 DIAGNOSIS — E559 Vitamin D deficiency, unspecified: Secondary | ICD-10-CM | POA: Diagnosis not present

## 2022-11-09 DIAGNOSIS — Z0000 Encounter for general adult medical examination without abnormal findings: Secondary | ICD-10-CM | POA: Diagnosis not present

## 2022-11-09 DIAGNOSIS — N189 Chronic kidney disease, unspecified: Secondary | ICD-10-CM | POA: Diagnosis not present

## 2022-11-09 DIAGNOSIS — E782 Mixed hyperlipidemia: Secondary | ICD-10-CM | POA: Diagnosis not present

## 2022-11-09 DIAGNOSIS — Z23 Encounter for immunization: Secondary | ICD-10-CM | POA: Diagnosis not present

## 2022-11-10 ENCOUNTER — Other Ambulatory Visit (HOSPITAL_BASED_OUTPATIENT_CLINIC_OR_DEPARTMENT_OTHER): Payer: Self-pay

## 2022-11-10 DIAGNOSIS — Z6830 Body mass index (BMI) 30.0-30.9, adult: Secondary | ICD-10-CM | POA: Diagnosis not present

## 2022-11-10 DIAGNOSIS — Z79899 Other long term (current) drug therapy: Secondary | ICD-10-CM | POA: Diagnosis not present

## 2022-11-10 DIAGNOSIS — Z9189 Other specified personal risk factors, not elsewhere classified: Secondary | ICD-10-CM | POA: Diagnosis not present

## 2022-11-10 DIAGNOSIS — E669 Obesity, unspecified: Secondary | ICD-10-CM | POA: Diagnosis not present

## 2022-11-10 DIAGNOSIS — I10 Essential (primary) hypertension: Secondary | ICD-10-CM | POA: Diagnosis not present

## 2022-11-10 MED ORDER — WEGOVY 1.7 MG/0.75ML ~~LOC~~ SOAJ
1.7000 mg | SUBCUTANEOUS | 0 refills | Status: DC
  Filled 2022-11-10: qty 3, 28d supply, fill #0
  Filled 2022-11-20: qty 0.75, 28d supply, fill #0
  Filled 2022-12-01 (×2): qty 3, 28d supply, fill #0

## 2022-11-16 ENCOUNTER — Telehealth: Payer: Self-pay | Admitting: *Deleted

## 2022-11-16 NOTE — Telephone Encounter (Signed)
-----   Message from [facility] Nakiya Rallis G sent at 10/27/2022  3:25 PM EDT -----  Please check for pt's lipid labs per MSW.

## 2022-11-16 NOTE — Telephone Encounter (Signed)
S/w pt stated pt had labs drawn at [facility], Dr. Recardo Evangelist office last week.  Will send to [facility] to see if she could get theses please.

## 2022-11-20 ENCOUNTER — Other Ambulatory Visit (HOSPITAL_BASED_OUTPATIENT_CLINIC_OR_DEPARTMENT_OTHER): Payer: Self-pay

## 2022-12-01 ENCOUNTER — Other Ambulatory Visit (HOSPITAL_BASED_OUTPATIENT_CLINIC_OR_DEPARTMENT_OTHER): Payer: Self-pay

## 2022-12-08 ENCOUNTER — Other Ambulatory Visit (HOSPITAL_BASED_OUTPATIENT_CLINIC_OR_DEPARTMENT_OTHER): Payer: Self-pay

## 2022-12-08 DIAGNOSIS — I10 Essential (primary) hypertension: Secondary | ICD-10-CM | POA: Diagnosis not present

## 2022-12-08 DIAGNOSIS — Z6830 Body mass index (BMI) 30.0-30.9, adult: Secondary | ICD-10-CM | POA: Diagnosis not present

## 2022-12-08 DIAGNOSIS — Z79899 Other long term (current) drug therapy: Secondary | ICD-10-CM | POA: Diagnosis not present

## 2022-12-08 DIAGNOSIS — E669 Obesity, unspecified: Secondary | ICD-10-CM | POA: Diagnosis not present

## 2022-12-08 MED ORDER — WEGOVY 1.7 MG/0.75ML ~~LOC~~ SOAJ
1.7000 mg | SUBCUTANEOUS | 0 refills | Status: DC
  Filled 2022-12-08 – 2022-12-15 (×2): qty 3, 28d supply, fill #0

## 2022-12-15 ENCOUNTER — Other Ambulatory Visit (HOSPITAL_BASED_OUTPATIENT_CLINIC_OR_DEPARTMENT_OTHER): Payer: Self-pay

## 2022-12-19 ENCOUNTER — Other Ambulatory Visit (HOSPITAL_BASED_OUTPATIENT_CLINIC_OR_DEPARTMENT_OTHER): Payer: Self-pay

## 2022-12-22 DIAGNOSIS — F411 Generalized anxiety disorder: Secondary | ICD-10-CM | POA: Diagnosis not present

## 2022-12-22 DIAGNOSIS — F331 Major depressive disorder, recurrent, moderate: Secondary | ICD-10-CM | POA: Diagnosis not present

## 2023-01-04 ENCOUNTER — Other Ambulatory Visit (HOSPITAL_BASED_OUTPATIENT_CLINIC_OR_DEPARTMENT_OTHER): Payer: Self-pay

## 2023-01-04 DIAGNOSIS — I10 Essential (primary) hypertension: Secondary | ICD-10-CM | POA: Diagnosis not present

## 2023-01-04 DIAGNOSIS — E663 Overweight: Secondary | ICD-10-CM | POA: Diagnosis not present

## 2023-01-04 DIAGNOSIS — Z79899 Other long term (current) drug therapy: Secondary | ICD-10-CM | POA: Diagnosis not present

## 2023-01-04 DIAGNOSIS — Z6829 Body mass index (BMI) 29.0-29.9, adult: Secondary | ICD-10-CM | POA: Diagnosis not present

## 2023-01-04 MED ORDER — WEGOVY 1.7 MG/0.75ML ~~LOC~~ SOAJ
1.7000 mg | SUBCUTANEOUS | 1 refills | Status: DC
  Filled 2023-01-04 – 2023-02-03 (×3): qty 3, 28d supply, fill #0
  Filled 2023-03-05 (×2): qty 3, 28d supply, fill #1

## 2023-01-08 ENCOUNTER — Other Ambulatory Visit (HOSPITAL_BASED_OUTPATIENT_CLINIC_OR_DEPARTMENT_OTHER): Payer: Self-pay

## 2023-01-14 ENCOUNTER — Other Ambulatory Visit: Payer: Self-pay

## 2023-01-14 ENCOUNTER — Other Ambulatory Visit (HOSPITAL_BASED_OUTPATIENT_CLINIC_OR_DEPARTMENT_OTHER): Payer: Self-pay

## 2023-01-17 ENCOUNTER — Other Ambulatory Visit (HOSPITAL_BASED_OUTPATIENT_CLINIC_OR_DEPARTMENT_OTHER): Payer: Self-pay

## 2023-02-03 ENCOUNTER — Other Ambulatory Visit (HOSPITAL_BASED_OUTPATIENT_CLINIC_OR_DEPARTMENT_OTHER): Payer: Self-pay

## 2023-03-05 ENCOUNTER — Other Ambulatory Visit (HOSPITAL_BASED_OUTPATIENT_CLINIC_OR_DEPARTMENT_OTHER): Payer: Self-pay

## 2023-03-31 ENCOUNTER — Other Ambulatory Visit (HOSPITAL_BASED_OUTPATIENT_CLINIC_OR_DEPARTMENT_OTHER): Payer: Self-pay

## 2023-03-31 MED ORDER — WEGOVY 1.7 MG/0.75ML ~~LOC~~ SOAJ
1.7000 mg | SUBCUTANEOUS | 0 refills | Status: DC
  Filled 2023-03-31: qty 3, 28d supply, fill #0

## 2023-04-28 ENCOUNTER — Other Ambulatory Visit (HOSPITAL_BASED_OUTPATIENT_CLINIC_OR_DEPARTMENT_OTHER): Payer: Self-pay

## 2023-04-28 MED ORDER — WEGOVY 1.7 MG/0.75ML ~~LOC~~ SOAJ
1.7000 mg | SUBCUTANEOUS | 0 refills | Status: DC
  Filled 2023-04-28: qty 3, 28d supply, fill #0

## 2023-05-16 ENCOUNTER — Other Ambulatory Visit (HOSPITAL_BASED_OUTPATIENT_CLINIC_OR_DEPARTMENT_OTHER): Payer: Self-pay

## 2023-05-31 ENCOUNTER — Other Ambulatory Visit (HOSPITAL_BASED_OUTPATIENT_CLINIC_OR_DEPARTMENT_OTHER): Payer: Self-pay

## 2023-05-31 MED ORDER — ZEPBOUND 2.5 MG/0.5ML ~~LOC~~ SOAJ
2.5000 mg | SUBCUTANEOUS | 0 refills | Status: AC
  Filled 2023-05-31: qty 2, 28d supply, fill #0

## 2023-06-01 ENCOUNTER — Other Ambulatory Visit (HOSPITAL_BASED_OUTPATIENT_CLINIC_OR_DEPARTMENT_OTHER): Payer: Self-pay

## 2023-06-09 ENCOUNTER — Encounter (HOSPITAL_BASED_OUTPATIENT_CLINIC_OR_DEPARTMENT_OTHER): Payer: Self-pay | Admitting: Nurse Practitioner

## 2023-06-09 ENCOUNTER — Ambulatory Visit (INDEPENDENT_AMBULATORY_CARE_PROVIDER_SITE_OTHER): Admitting: Nurse Practitioner

## 2023-06-09 VITALS — BP 122/70 | HR 63 | Ht 61.5 in | Wt 147.0 lb

## 2023-06-09 DIAGNOSIS — I10 Essential (primary) hypertension: Secondary | ICD-10-CM | POA: Diagnosis not present

## 2023-06-09 DIAGNOSIS — I2584 Coronary atherosclerosis due to calcified coronary lesion: Secondary | ICD-10-CM

## 2023-06-09 DIAGNOSIS — I77819 Aortic ectasia, unspecified site: Secondary | ICD-10-CM

## 2023-06-09 DIAGNOSIS — E785 Hyperlipidemia, unspecified: Secondary | ICD-10-CM

## 2023-06-09 DIAGNOSIS — I2510 Atherosclerotic heart disease of native coronary artery without angina pectoris: Secondary | ICD-10-CM

## 2023-06-09 NOTE — Progress Notes (Signed)
 Cardiology Office Note:  .   Date:  06/09/2023 ID:  Jacqueline Riggs, DOB December 09, 1955, MRN [id number] PCP: Ruven Coy, MD [facility] Providers Cardiologist:  None   Patient Profile: .      PMH Elevated coronary calcium  score 33.5 (63rd percentile) Hypertension Family history Father - history of MI, stroke - aortic aneurysm, died from complications including colon and kidney problems Younger brother - valve replacement and AICD Mother -  strokes, died in her 30s 2 brothers died from strokes Former tobacco abuse Aortic dilatation 4 cm on CT 09/2022  Referred to cardiology as a new patient for elevated coronary calcium  score and seen by me on 10/27/2022. Concerned about the history of strokes in her family. Overall feeling well. 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 Her job involves some standing/walking and some sitting. She plays the horn and only time she has experienced shortness of breath was when playing outside in extreme heat. She and her husband have been working on following a better diet for his diabetes. They met with a nutritionist.  She was previously going to classes at her gym but this no longer fits her schedule so she has been taking extra laps inside the stores when shopping for exercise. History of high cholesterol, has been followed by PCP. Scheduled for follow-up lab work with PCP in 2 weeks. She denied chest pain, shortness of breath, lower extremity edema, fatigue, palpitations, melena, presyncope, syncope, orthopnea, and PND.  ASCVD risk score was 22%.  She admitted to some dietary indiscretion enjoying biscuits and hashbrowns, and occasional pork chops fried in oil and butter.Lipid panel 11/09/22 revealed well controlled cholesterol. No further cardiac testing was indicated and she was advised to return in 6 months for follow-up.       History of Present Illness: .    History of Present Illness Jacqueline Riggs is a pleasant 68 y.o. female  who is here today for follow-up of CAD. She reports she is recovering from recent flu illness, having completed Tamiflu Rx.  She reports no new concerns since the last visit. Reports illness has temporarily disrupted her exercise routine and diet. She plans to join [employer] and resume her exercise routine. She has lost a total of 47 pounds since starting on Zepbound . She reports a good appetite and is trying to maintain a healthy diet, despite occasional indulgences due to her husband's sweet tooth. She has been walking but acknowledges the need for more weight lifting. 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 However, she has not had significant symptoms requiring her to stop. She denies chest pain, palpitations, orthopnea, PND, presyncope, syncope. BP has been well-controlled, although it was slightly low during a recent visit to a walk-in clinic for Tamiflu.    Diet: Tries to eat diabetic diet, met with nutritionist Limiting carbohydrates Fried pork chop in oil and butter, chopped salad, red and brown quinoa  Eats fruit and yogurt but also eats biscuits and hash browns   Activity: Walking for exercise with extra laps around the inside of stores when shopping Has a gym membership but does not use it, time of class she enjoys does not coordinate   ROS: See HPI       Studies Reviewed: .  Risk Assessment/Calculations:         Physical Exam:   VS: BP 122/70 (BP Location: Left Arm, Patient Position: Sitting, Cuff Size: Normal)   Pulse 63   Ht 5' 1.5" (1.562 m)   Wt 147 lb (66.7 kg)   SpO2 100%   BMI 27.33 kg/m   Wt Readings from Last 3 Encounters:  06/09/23 147 lb (66.7 kg)  10/27/22 172 lb (78 kg)  02/22/21 197 lb (89.4 kg)     GEN: Well nourished, well developed in no acute distress NECK: No JVD; No carotid  bruits CARDIAC: RRR, no murmurs, rubs, gallops RESPIRATORY:  Clear to auscultation without rales, wheezing or rhonchi  ABDOMEN: Soft, non-tender, non-distended EXTREMITIES:  No edema; No deformity     ASSESSMENT AND PLAN: .    Elevated coronary artery calcium  score: CT calcium  score 33.5 Agatston units (63rd percentile) completed on 10/07/22. She denies chest pain, dyspnea, or other symptoms concerning for angina.  No indication for further ischemic evaluation at this time. Emphasized the importance of secondary prevention including 150 minutes of moderate intensity exercise each week along with heart healthy diet avoiding saturated fat, processed foods, simple carbohydrates, and sugar. Continue, amlodipine , atenolol, rosuvastatin .   Aortic dilatation: Mild dilatation of the ascending aorta at 4 cm on CT 10/07/22. Reports her father had aortic aneurysm. Repeat CTA ordered for August 2025 with BMET prior. No acute concerns today.   Hypertension: BP is well controlled. No medication changes today.   Dyslipidemia: Lipid panel 11/09/22 with total cholesterol 130, triglycerides 84, HDL 51, LDL-C 63. Lipids are well controlled. Continue rosuvastatin .      Disposition: 1 year with Dr. Veryl Gottron or me  Signed, Slater Duncan, NP-C

## 2023-06-09 NOTE — Patient Instructions (Addendum)
 Medication Instructions:  Your physician recommends that you continue on your current medications as directed. Please refer to the Current Medication list given to you today.  *If you need a refill on your cardiac medications before your next appointment, please call your pharmacy*  Lab Work: Your physician recommends that you return for lab work one week before CT Aorta in August  If you have labs (blood work) drawn today and your tests are completely normal, you will receive your results only by: [employer] (if you have MyChart) OR A paper copy in the mail If you have any lab test that is abnormal or we need to change your treatment, we will call you to review the results.  Testing/Procedures: Your provider has recommended a CT Aorta in August   Follow-Up: At [facility] you and your health needs are our priority.  As part of our continuing mission to provide you with exceptional heart care, our providers are all part of one team.  This team includes your primary Cardiologist (physician) and Advanced Practice Providers or APPs (Physician Assistants and Nurse Practitioners) who all work together to provide you with the care you need, when you need it.  Please follow up in 1 year  with Dr. Veryl Gottron, Slater Duncan, NP or Neomi Banks, NP   We recommend signing up for the patient portal called "MyChart".  Sign up information is provided on this After Visit Summary.  MyChart is used to connect with patients for Virtual Visits (Telemedicine).  Patients are able to view lab/test results, encounter notes, upcoming appointments, etc.  Non-urgent messages can be sent to your provider as well.   To learn more about what you can do with MyChart, go to [email or web address].

## 2023-06-28 ENCOUNTER — Other Ambulatory Visit: Payer: Self-pay

## 2023-06-28 ENCOUNTER — Other Ambulatory Visit (HOSPITAL_BASED_OUTPATIENT_CLINIC_OR_DEPARTMENT_OTHER): Payer: Self-pay

## 2023-06-28 MED ORDER — ZEPBOUND 5 MG/0.5ML ~~LOC~~ SOAJ
5.0000 mg | SUBCUTANEOUS | 0 refills | Status: AC
Start: 2023-06-28 — End: ?
  Filled 2023-06-28: qty 2, 28d supply, fill #0

## 2023-07-28 ENCOUNTER — Other Ambulatory Visit (HOSPITAL_BASED_OUTPATIENT_CLINIC_OR_DEPARTMENT_OTHER): Payer: Self-pay

## 2023-07-28 MED ORDER — ZEPBOUND 7.5 MG/0.5ML ~~LOC~~ SOAJ
7.5000 mg | SUBCUTANEOUS | 1 refills | Status: DC
  Filled 2023-07-28: qty 2, 28d supply, fill #0
  Filled 2023-08-22: qty 2, 28d supply, fill #1

## 2023-08-16 ENCOUNTER — Encounter (HOSPITAL_BASED_OUTPATIENT_CLINIC_OR_DEPARTMENT_OTHER): Payer: Self-pay

## 2023-08-22 ENCOUNTER — Other Ambulatory Visit (HOSPITAL_BASED_OUTPATIENT_CLINIC_OR_DEPARTMENT_OTHER): Payer: Self-pay

## 2023-08-23 ENCOUNTER — Other Ambulatory Visit: Payer: Self-pay

## 2023-09-13 ENCOUNTER — Ambulatory Visit: Payer: Self-pay | Admitting: Nurse Practitioner

## 2023-09-13 LAB — BASIC METABOLIC PANEL WITH GFR
BUN/Creatinine Ratio: 15 (ref 12–28)
BUN: 15 mg/dL (ref 8–27)
CO2: 22 mmol/L (ref 20–29)
Calcium: 9.8 mg/dL (ref 8.7–10.3)
Chloride: 100 mmol/L (ref 96–106)
Creatinine, Ser: 1.02 mg/dL — ABNORMAL HIGH (ref 0.57–1.00)
Glucose: 90 mg/dL (ref 70–99)
Potassium: 4.3 mmol/L (ref 3.5–5.2)
Sodium: 137 mmol/L (ref 134–144)
eGFR: 60 mL/min/1.73 (ref >59)

## 2023-09-19 ENCOUNTER — Ambulatory Visit (HOSPITAL_BASED_OUTPATIENT_CLINIC_OR_DEPARTMENT_OTHER)
Admission: RE | Admit: 2023-09-19 | Discharge: 2023-09-19 | Disposition: A | Discharge status: Home / Self Care | Source: Ambulatory Visit | Attending: Nurse Practitioner | Admitting: Nurse Practitioner

## 2023-09-19 DIAGNOSIS — I77819 Aortic ectasia, unspecified site: Secondary | ICD-10-CM | POA: Diagnosis present

## 2023-09-19 MED ORDER — IOHEXOL 350 MG/ML SOLN
100.0000 mL | Freq: Once | INTRAVENOUS | Status: AC | PRN
  Administered 2023-09-19: 100 mL via INTRAVENOUS

## 2023-09-26 ENCOUNTER — Other Ambulatory Visit (HOSPITAL_BASED_OUTPATIENT_CLINIC_OR_DEPARTMENT_OTHER): Payer: Self-pay

## 2023-09-26 MED ORDER — ZEPBOUND 7.5 MG/0.5ML ~~LOC~~ SOAJ
7.5000 mg | SUBCUTANEOUS | 1 refills | Status: AC
  Filled 2023-09-26: qty 2, 28d supply, fill #0
  Filled 2023-10-26: qty 2, 28d supply, fill #1

## 2023-10-26 ENCOUNTER — Other Ambulatory Visit (HOSPITAL_BASED_OUTPATIENT_CLINIC_OR_DEPARTMENT_OTHER): Payer: Self-pay

## 2023-11-15 ENCOUNTER — Ambulatory Visit (INDEPENDENT_AMBULATORY_CARE_PROVIDER_SITE_OTHER): Admitting: Dermatology

## 2023-11-15 ENCOUNTER — Encounter: Payer: Self-pay | Admitting: Dermatology

## 2023-11-15 VITALS — BP 124/76

## 2023-11-15 DIAGNOSIS — D485 Neoplasm of uncertain behavior of skin: Secondary | ICD-10-CM | POA: Diagnosis not present

## 2023-11-15 NOTE — Patient Instructions (Addendum)

## 2023-11-15 NOTE — Progress Notes (Signed)
   New Patient Visit   Subjective  Jacqueline Riggs is a 68 y.o. female who presents for the following: Cyst of right post shoulder x years that has gotten bigger. It has abscessed and ruptured 3 times. She would like to see about having it removed.    The following portions of the chart were reviewed this encounter and updated as appropriate: medications, allergies, medical history  Review of Systems:  No other skin or systemic complaints except as noted in HPI or Assessment and Plan.  Objective  Well appearing patient in no apparent distress; mood and affect are within normal limits.   A focused examination was performed of the following areas: Right post shoulder   Relevant exam findings are noted in the Assessment and Plan.  Right Upper Back 2.5 x 2.0 cm soft SQ nodule   Assessment & Plan   Epidermal inclusion cyst of right posterior shoulder  The epidermal inclusion cyst on the right posterior shoulder measures 2.5 cm by 2 cm. It is a soft, non-tender cutaneous cystic lesion with a history of recurrent infections, although the most recent infection was a couple of years ago. The cyst has increased in size. There is a small risk of recurrence even after surgical excision. Avoid manipulation of the cyst to prevent inflammation or infection, which could delay surgical intervention. If the cyst is inflamed or infected at the time of surgery, the procedure may be postponed as it could increase the chances of recurrence.  - Refer to plastic surgery group for excision of the cyst - Advise to avoid touching the cyst to prevent inflammation or infection - Provide samples of moisturizers to keep skin hydrated through the winter  NEOPLASM OF UNCERTAIN BEHAVIOR OF SKIN Right Upper Back Ambulatory referral to Plastic Surgery We will refer her to [facility] for excision.  Return if symptoms worsen or fail to improve.  I, Roseline Hutchinson, CMA, am acting as scribe for [employer], DO .   Documentation: I have reviewed the above documentation for accuracy and completeness, and I agree with the above.  Delon Lenis, DO

## 2023-11-17 ENCOUNTER — Ambulatory Visit (INDEPENDENT_AMBULATORY_CARE_PROVIDER_SITE_OTHER)

## 2023-11-17 VITALS — BP 106/67 | HR 60 | Ht 61.5 in | Wt 141.0 lb

## 2023-11-17 DIAGNOSIS — L720 Epidermal cyst: Secondary | ICD-10-CM

## 2023-11-17 DIAGNOSIS — R222 Localized swelling, mass and lump, trunk: Secondary | ICD-10-CM | POA: Diagnosis not present

## 2023-11-17 NOTE — Progress Notes (Signed)
 NAME: Jacqueline Riggs  MRN: [id number]  DOB: 1955-04-15   Referring physician: Alm Delon SAILOR, DO  PCP: Frederik Charleston, MD   CHIEF COMPLAINT: Soft tissue mass of the right back  HPI:  Jacqueline Riggs is a 68 y.o. year old female who presents with a soft tissue mass that is non painful, soft and has enlarged over the last few months slowly. Had episodes of infection noted. Has had this mass for 25 years. Former smoker, stopped 30 years ago. Multiple medical comorbidities as stated below.   PMH: Past Medical History:  Diagnosis Date   Arthritis    Chronic kidney disease    Stage 3   Depression    Hypertension    Sleep apnea    PSH: Past Surgical History:  Procedure Laterality Date   DG THUMB LEFT HAND Left    replacement   JOINT REPLACEMENT Left 05/24/1998   JOINT REPLACEMENT Right 02/23/2005   KNEE ARTHROSCOPY Left 10/07/1992   08/03/07   KNEE ARTHROSCOPY Right 12/18/2002   LAPAROTOMY N/A 02/23/2021   Procedure: EXPLORATORY LAPAROTOMY, small bowel resection of anastomosis;  Surgeon: Stevie Herlene Righter, MD;  Location: [facility];  Service: General;  Laterality: N/A;   Left finger trigger release Left 09/22/2005   LIPOMA EXCISION Right    Flank   MOUTH SURGERY  06/25/2002   TOTAL HIP ARTHROPLASTY Left 06/30/2018   Procedure: TOTAL HIP ARTHROPLASTY ANTERIOR APPROACH;  Surgeon: Yvone Rush, MD;  Location: [facility];  Service: Orthopedics;  Laterality: Left;   TOTAL SHOULDER ARTHROPLASTY Right 01/04/2006   WISDOM TOOTH EXTRACTION  01/1971     MEDICATIONS:   Current Outpatient Medications:    acetaminophen  (TYLENOL ) 500 MG tablet, Take 500-1,000 mg by mouth every 6 (six) hours as needed for mild pain or headache., Disp: , Rfl:    amLODipine  (NORVASC ) 10 MG tablet, Take 10 mg by mouth daily., Disp: , Rfl:    calcium  carbonate (TUMS - DOSED IN MG ELEMENTAL CALCIUM ) 500 MG chewable tablet, Chew 3 tablets by mouth at bedtime., Disp: , Rfl:    Cholecalciferol (VITAMIN D) 50 MCG  (2000 UT) tablet, Take 4,000 Units by mouth daily., Disp: , Rfl:    Coenzyme Q10 100 MG capsule, Take 100 mg by mouth daily., Disp: , Rfl:    desvenlafaxine (PRISTIQ) 50 MG 24 hr tablet, Take 50 mg by mouth daily., Disp: , Rfl:    famotidine  (PEPCID ) 20 MG tablet, Take 20 mg by mouth 2 (two) times daily., Disp: , Rfl:    labetalol  (NORMODYNE ) 100 MG tablet, Take 100 mg by mouth 2 (two) times daily., Disp: , Rfl:    lamoTRIgine  (LAMICTAL ) 150 MG tablet, Take 150 mg by mouth at bedtime., Disp: , Rfl:    levocetirizine (XYZAL) 5 MG tablet, Take 5 mg by mouth every evening., Disp: , Rfl:    rosuvastatin  (CRESTOR ) 10 MG tablet, Take 10 mg by mouth at bedtime., Disp: , Rfl:    tirzepatide  (ZEPBOUND ) 2.5 MG/0.5ML Pen, Inject 2.5 mg into the skin once a week., Disp: 2 mL, Rfl: 0   tirzepatide  (ZEPBOUND ) 5 MG/0.5ML Pen, Inject 5 mg into the skin once a week., Disp: 2 mL, Rfl: 0   tirzepatide  (ZEPBOUND ) 7.5 MG/0.5ML Pen, Inject 7.5 mg into the skin once a week., Disp: 2 mL, Rfl: 1   ALLERGIES:  is allergic to penicillins, dexilant [dexlansoprazole], prilosec [omeprazole], shellfish allergy, ceclor [cefaclor], hydrochlorothiazide, latex, relafen [nabumetone], and sulfa antibiotics.   FAMILY HISTORY:  Family History  Problem Relation Age of Onset   Stroke Mother    Heart attack Father    Stroke Father    Stroke Brother    Stroke Brother       VITALS:  Vitals:   11/17/23 1021  BP: 106/67  Pulse: 60  SpO2: 99%    Constitutional: Good color, good hydration. VSS. Head and Neck: No lymphadenopathy, thyromegaly or masses  Chest: Normal breathing, Normal shape and excursion.  Subcutaneous mass measures 3 cm x 2 cm. Currently not infected.  Mobile and not attached to underlying structures.  No basin lymphadenopathy, satellite lesions or in-transit lesions.  ASSESSMENT/PLAN  Assessment & Plan   Pt. presents with a subcutaneous mass most representative of epidermal inclusion  cyst.  Today we discussed the risks, benefits and alternatives to mass excision and possible tissue rearrangement. We discussed the alternatives which include continued observation; however, I told the patient that I do not believe this mass will resolve on its own. We then discussed the benefits of surgical excision which include complete removal of the lesion. We discussed the risks of excision which include seroma, hematoma, infection, bleeding, damage to surrounding healthy tissue, damage to surrounding structures and need for further surgery. We also discussed the risks of hair loss, wound separation and recurrence of the mass. We discussed scar patterns of tissue rearrangement, if needed.  I explained that the mass will be sent to pathology and if it were to be malignant further surgery may be needed. We discussed the risks of anesthesia. The patient has a good understanding of all the risks and benefits, postoperative course and care. We obtained pictures. All questions were answered.  Recommend excision in the treatment room. Risks, benefits and limitations were discussed. Procedure will be precertified and scheduled.

## 2023-12-15 ENCOUNTER — Ambulatory Visit (INDEPENDENT_AMBULATORY_CARE_PROVIDER_SITE_OTHER)

## 2023-12-15 ENCOUNTER — Other Ambulatory Visit (HOSPITAL_COMMUNITY)
Admission: RE | Admit: 2023-12-15 | Discharge: 2023-12-15 | Disposition: A | Discharge status: Home / Self Care | Source: Ambulatory Visit

## 2023-12-15 VITALS — BP 134/69 | HR 89 | Temp 98.0°F

## 2023-12-15 DIAGNOSIS — L720 Epidermal cyst: Secondary | ICD-10-CM

## 2023-12-15 NOTE — Addendum Note (Signed)
 Addended by: EUSTACIO POUR on: 12/15/2023 11:57 AM   Modules accepted: Orders

## 2023-12-15 NOTE — Progress Notes (Signed)
 PROCEDURE NOTE PLASTIC SURGERY PROCEDURE NOTE   Procedure: Excision of back mass   Diagnosis: Mass of right back   Surgeon: Nieves HERO. Saryn Cherry, MD   Anesthesia: 1% lidocaine  with epinephrine  1:100,000 Mixed with 0.25% Marcaine  50/50 dilution (total  5 mL)   Complications: None   Specimen: Cyst, sent to phatology    DESCRIPTION OF THE PROCEDURE IN DETAIL:   We discussed the risks benefits and alternatives to lesion excision. We discussed the alternatives which would be observation of the lesion. We discussed of the risks of the lesion then becoming larger or becoming infected, complicating any future excision. We discussed the benefits of excision which include removal of the lesion and the ability to examine it under a microscope to rule out the presence of a malignancy. We also discussed the benefits of removing the lesion from a symptomatic perspective. We discussed the risks of surgery, including but not limited to infection, bleeding, damage to the surrounding healthy tissues and the need for further surgery. We also discussed the risks of wound separation, poor scarring and lesion recurrence. We also discussed the risks of anesthesia. The patient again voiced their understanding of the above and once again confirmed informed consent.   The patient was identified by name, date of birth and medical record number. The patient's back  was prepped and draped in the standard sterile fashion using betadine  solution. Timeout was conducted and preoperative instrument and needle counts were correct.    Local anesthesia was injected. The incision was then made on top of the back mass. The mass was circumferentially dissected, and it should be noted that it had cystic appearance and was friable. The size of the mass was 2 cm x 2 cm. The mass was removed and sent as a pathological specimen. The residual 3 centimeter wound was closed in layers using 3-0 Vicryl and 3-0 Monocryl for the deep layers and  4-0 Prolene for the epidermis. The incision was dressed with bacitracin.    The patient tolerated the procedure well. There were no immediate complications. Postoperative instrument and needle counts were correct.    Postoperative care discussed, return to clinic in 14 days.   Jacqueline Winkles M. Darletta Noblett, MD [facility]

## 2023-12-20 LAB — SURGICAL PATHOLOGY

## 2023-12-29 ENCOUNTER — Ambulatory Visit (INDEPENDENT_AMBULATORY_CARE_PROVIDER_SITE_OTHER): Payer: Self-pay

## 2023-12-29 VITALS — BP 156/79 | HR 63 | Ht 61.5 in | Wt 141.0 lb

## 2023-12-29 DIAGNOSIS — Z09 Encounter for follow-up examination after completed treatment for conditions other than malignant neoplasm: Secondary | ICD-10-CM

## 2023-12-29 DIAGNOSIS — Z9889 Other specified postprocedural states: Secondary | ICD-10-CM

## 2023-12-29 NOTE — Progress Notes (Signed)
   Established Patient Office Visit  Subjective   Patient ID: Jacqueline Riggs, female    DOB: [birth year]  Age: 68 y.o. MRN: [id number]  Chief Complaint  Patient presents with   Follow-up    HPI  Status post excision of right back cyst.  Patient has been doing well after the procedure, is states that the pain has been under control.  States that her cat has been licking and scratching on the wound.  No signs of infection noticed.  Pathology showed an epidermal inclusion cyst.  This was discussed with the patient.    Objective:     BP (!) 156/79 (BP Location: Left Arm, Patient Position: Sitting, Cuff Size: Normal)   Pulse 63   Ht 5' 1.5 (1.562 m)   Wt 141 lb (64 kg)   SpO2 98%   BMI 26.21 kg/m  BP Readings from Last 3 Encounters:  12/29/23 (!) 156/79  12/15/23 134/69  11/17/23 106/67      Physical Exam MA as chaperone Right back incision clean dry and intact, mild erythema related to sutures.  No signs of infection otherwise noticed. No seroma or hematoma. No results found for any visits on 12/29/23.  Last CBC Lab Results  Component Value Date   WBC 6.5 02/27/2021   HGB 11.8 (L) 02/27/2021   HCT 36.2 02/27/2021   MCV 92.6 02/27/2021   MCH 30.2 02/27/2021   RDW 14.0 02/27/2021   PLT 219 02/27/2021   Last metabolic panel Lab Results  Component Value Date   GLUCOSE 90 09/12/2023   NA 137 09/12/2023   K 4.3 09/12/2023   CL 100 09/12/2023   CO2 22 09/12/2023   BUN 15 09/12/2023   CREATININE 1.02 (H) 09/12/2023   EGFR 60 09/12/2023   CALCIUM  9.8 09/12/2023   PHOS 3.5 02/27/2021   PROT 5.3 (L) 02/27/2021   ALBUMIN 2.6 (L) 02/27/2021   BILITOT 0.7 02/27/2021   ALKPHOS 40 02/27/2021   AST 40 02/27/2021   ALT 37 02/27/2021   ANIONGAP 11 02/27/2021      The ASCVD Risk score (Arnett DK, et al., 2019) failed to calculate for the following reasons:   Cannot find a previous HDL lab   Cannot find a previous total cholesterol lab    Assessment & Plan:    Problem List Items Addressed This Visit   None Visit Diagnoses       Follow-up exam    -  Primary      Patient is status post epidermal inclusion cyst excision from right back.  Stitches were removed today.  Patient tolerated well.  Had a long conversation regarding cat bite and scratch infections.  Instructed the patient to keep the wound covered at all times, and away from her cats.  Apply Vaseline for another week and keep the wound covered.  No submerging the incision for 6 weeks was emphasized.  Patient will follow-up with me in 3 months.  Instructed to call if any issues or concerns arise.  Tamora Huneke M Bonifacio Pruden, MD

## 2024-03-30 ENCOUNTER — Ambulatory Visit
